# Patient Record
Sex: Female | Born: 1995 | Race: White | Hispanic: No | Marital: Single
Health system: Midwestern US, Community
[De-identification: ages and names within clinical notes are randomized; demographics above are authoritative.]

---
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
## 2019-05-29 ENCOUNTER — Other Ambulatory Visit: Payer: Self-pay

## 2019-06-03 ENCOUNTER — Ambulatory Visit (INDEPENDENT_AMBULATORY_CARE_PROVIDER_SITE_OTHER): Admitting: Certified Nurse Midwife

## 2019-06-03 ENCOUNTER — Encounter: Payer: Self-pay | Admitting: Certified Nurse Midwife

## 2019-06-03 ENCOUNTER — Other Ambulatory Visit (HOSPITAL_COMMUNITY)
Admission: RE | Admit: 2019-06-03 | Discharge: 2019-06-03 | Disposition: A | Discharge status: Home / Self Care | Source: Ambulatory Visit | Attending: Certified Nurse Midwife | Admitting: Certified Nurse Midwife

## 2019-06-03 VITALS — BP 124/72 | HR 88 | Temp 98.3°F | Resp 14 | Ht 63.0 in | Wt 198.2 lb

## 2019-06-03 DIAGNOSIS — Z01419 Encounter for gynecological examination (general) (routine) without abnormal findings: Secondary | ICD-10-CM | POA: Diagnosis not present

## 2019-06-03 DIAGNOSIS — Z124 Encounter for screening for malignant neoplasm of cervix: Secondary | ICD-10-CM

## 2019-06-03 DIAGNOSIS — Z30431 Encounter for routine checking of intrauterine contraceptive device: Secondary | ICD-10-CM

## 2019-06-03 DIAGNOSIS — Z789 Other specified health status: Secondary | ICD-10-CM

## 2019-06-03 NOTE — Progress Notes (Signed)
23 y.o. [id number] Married  Caucasian Fe here as a new patient to establish care and for gyn exam. Patient would like to discuss IUD removal. Mirena IUD inserted November 2017. Would like to have removal to try for pregnancy this year. Periods sporadic with Mirena. Has not tried to feel for string. Patient currently active in [employer]. She had c/section with first pregnancy and preeclampsia. She is hopeful to avoid any problems with the next pregnancy. 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 No other health issues today.  Patient's last menstrual period was 04/21/2019 (within days).          Sexually active: Yes.    The current method of family planning is IUD.    Exercising: Yes.    walking Smoker:  no  Review of Systems  Constitutional: Negative.   HENT: Negative.   Eyes: Negative.   Respiratory: Negative.   Cardiovascular: Negative.   Gastrointestinal: Negative.   Genitourinary: Negative.   Musculoskeletal: Negative.   Skin: Negative.   Neurological: Negative.   Endo/Heme/Allergies: Negative.   Psychiatric/Behavioral: Negative.     Health Maintenance: Pap: about 2018 per patient -- [place] in [place] -- normal History of Abnormal Pap: no MMG:  n/a Self Breast exams: yes Colonoscopy:  n/a BMD:   n/a TDaP:  2017 Shingles: n/a Pneumonia: n/a Hep C and HIV: negative in the past per patient  Labs: none   reports that she has never smoked. She has never used smokeless tobacco. She reports that she does not drink alcohol or use drugs.  History reviewed. No pertinent past medical history.  Past Surgical History:  Procedure Laterality Date  . CESAREAN SECTION      No current outpatient medications on file.   No current facility-administered medications for this visit.     Family History  Problem Relation Age of Onset  . Breast cancer Maternal Grandmother   . Colon cancer Maternal Grandfather     ROS:  Pertinent items are noted in HPI.   Otherwise, a comprehensive ROS was negative.  Exam:   BP 124/72 (BP Location: Right Arm, Patient Position: Sitting, Cuff Size: Large)   Pulse 88   Temp 98.3 F (36.8 C) (Temporal)   Resp 14   Ht 5\' 3"  (1.6 m)   Wt 198 lb 3.2 oz (89.9 kg)   LMP 04/21/2019 (Within Days)   BMI 35.11 kg/m  Height: 5\' 3"  (160 cm) Ht Readings from Last 3 Encounters:  06/03/19 5\' 3"  (1.6 m)    General appearance: alert, cooperative and appears stated age Head: Normocephalic, without obvious abnormality, atraumatic Neck: no adenopathy, supple, symmetrical, trachea midline and thyroid normal to inspection and palpation Lungs: clear to auscultation bilaterally Breasts: normal appearance, no masses or tenderness, No nipple retraction or dimpling, No nipple discharge or bleeding Heart: regular rate and rhythm Abdomen: soft, non-tender; no masses,  no organomegaly Extremities: extremities normal, atraumatic, no cyanosis or edema Skin: Skin color, texture, turgor normal. Has eczema fine rash at waistline, on upper arms, no weeping noted. Lymph nodes: Cervical, supraclavicular, and axillary nodes normal. No abnormal inguinal nodes palpated Neurologic: Grossly normal   Pelvic: External genitalia:  no lesions, normal female              Urethra:  normal appearing urethra with no masses, tenderness or lesions              Bartholin's and Skene's: normal  Vagina: normal appearing vagina with normal color and discharge, no lesions              Cervix: no cervical motion tenderness, no lesions and normal appearance IUD string noted in cervix              Pap taken: Yes.   Bimanual Exam:  Uterus:  normal size, contour, position, consistency, mobility, non-tender and anteverted              Adnexa: normal adnexa and no mass, fullness, tenderness               Rectovaginal: Confirms               Anus:  normal appearance, no lesions  Chaperone present: yes  A:  Well Woman with normal  exam  Contraception Mirena IUD   Overweight  Planning pregnancy  Rubella immune status unknown  Active duty in [employer]  IUD removal requested  P:   Reviewed health and wellness pertinent to exam  Discussed planning pregnancy and having less weight at onset. Patient aware and trying to work on. Discussed starting on Prenatal Vitamins now. Avoid ETOH. Discussed Rubella immune status. Will draw lab today.  Discussed order to be placed for IUD removal and she will be called with insurance information and scheduled for removal. Questions addressed.  Pap smear: yes   counseled on breast self exam, feminine hygiene, adequate intake of calcium and vitamin D, diet and exercise  return annually or prn  An After Visit Summary was printed and given to the patient.

## 2019-06-03 NOTE — Patient Instructions (Signed)
EXERCISE AND DIET:  We recommended that you start or continue a regular exercise program for good health. Regular exercise means any activity that makes your heart beat faster and makes you sweat.  We recommend exercising at least 30 minutes per day at least 3 days a week, preferably 4 or 5.  We also recommend a diet low in fat and sugar.  Inactivity, poor dietary choices and obesity can cause diabetes, heart attack, stroke, and kidney damage, among others.    ALCOHOL AND SMOKING:  Women should limit their alcohol intake to no more than 7 drinks/beers/glasses of wine (combined, not each!) per week. Moderation of alcohol intake to this level decreases your risk of breast cancer and liver damage. And of course, no recreational drugs are part of a healthy lifestyle.  And absolutely no smoking or even second hand smoke. Most people know smoking can cause heart and lung diseases, but did you know it also contributes to weakening of your bones? Aging of your skin?  Yellowing of your teeth and nails?  CALCIUM AND VITAMIN D:  Adequate intake of calcium and Vitamin D are recommended.  The recommendations for exact amounts of these supplements seem to change often, but generally speaking 600 mg of calcium (either carbonate or citrate) and 800 units of Vitamin D per day seems prudent. Certain women may benefit from higher intake of Vitamin D.  If you are among these women, your doctor will have told you during your visit.    PAP SMEARS:  Pap smears, to check for cervical cancer or precancers,  have traditionally been done yearly, although recent scientific advances have shown that most women can have pap smears less often.  However, every woman still should have a physical exam from her gynecologist every year. It will include a breast check, inspection of the vulva and vagina to check for abnormal growths or skin changes, a visual exam of the cervix, and then an exam to evaluate the size and shape of the uterus and  ovaries.  And after 23 years of age, a rectal exam is indicated to check for rectal cancers. We will also provide age appropriate advice regarding health maintenance, like when you should have certain vaccines, screening for sexually transmitted diseases, bone density testing, colonoscopy, mammograms, etc.    Healthy Weight Gain During Pregnancy, Adult A certain amount of weight gain during pregnancy is normal and healthy. How much weight you should gain depends on your overall health and a measurement called BMI (body mass index). BMI is an estimate of your body fat based on your height and weight. You can use an Software engineeronline calculator to figure out your BMI, or you can ask your health care provider to calculate it for you at your next visit. Your recommended pregnancy weight gain is based on your pre-pregnancy BMI. General guidelines for a healthy total weight gain during pregnancy are listed below. If your BMI at or before the start of your pregnancy is:  Less than 18.5 (underweight), you should gain 28-40 lb (13-18 kg).  18.5-24.9 (normal weight), you should gain 25-35 lb (11-16 kg).  25-29.9 (overweight), you should gain 15-25 lb (7-11 kg).  30 or higher (obese), you should gain 11-20 lb (5-9 kg). These ranges vary depending on your individual health. If you are carrying more than one baby (multiples), it may be safe to gain more weight than these recommendations. If you gain less weight than recommended, that may be safe as long as your baby is  growing and developing normally. How can unhealthy weight gain affect me and my baby? Gaining too much weight during pregnancy can lead to pregnancy complications, such as:  A temporary form of diabetes that develops during pregnancy (gestational diabetes).  High blood pressure during pregnancy and protein in your urine (preeclampsia).  High blood pressure during pregnancy without protein in your urine (gestational hypertension).  Your baby having a  high weight at birth, which may: ? Raise your risk of having a more difficult delivery or a surgical delivery (cesarean delivery, or C-section). ? Raise your child's risk of developing obesity during childhood. Not gaining enough weight can be life-threatening for your baby, and it may raise your baby's chances of:  Being born early (preterm).  Growing more slowly than normal during pregnancy (growth restriction).  Having a low weight at birth. What actions can I take to gain a healthy amount of weight during pregnancy? General instructions  Keep track of your weight gain during pregnancy.  Take over-the-counter and prescription medicines only as told by your health care provider. Take all prenatal supplements as directed.  Keep all health care visits during pregnancy (prenatal visits). These visits are a good time to discuss your weight gain. Your health care provider will weigh you at each visit to make sure you are gaining a healthy amount of weight. Nutrition   Eat a balanced, nutrient-rich diet. Eat plenty of: ? Fruits and vegetables, such as berries and broccoli. ? Whole grains, such as millet, barley, whole-wheat breads and cereals, and oatmeal. ? Low-fat dairy products or non-dairy products such as almond milk or rice milk. ? Protein foods, such as lean meat, chicken, eggs, and legumes (such as peas, beans, soybeans, and lentils).  Avoid foods that are fried or have a lot of fat, salt (sodium), or sugar.  Drink enough fluid to keep your urine pale yellow.  Choose healthy snack and drink options when you are at work or on the go: ? Drink water. Avoid soda, sports drinks, and juices that have added sugar. ? Avoid drinks with caffeine, such as coffee and energy drinks. ? Eat snacks that are high in protein, such as nuts, protein bars, and low-fat yogurt. ? Carry convenient snacks in your purse that do not need refrigeration, such as a pack of trail mix, an apple, or a granola  bar.  If you need help improving your diet, work with a health care provider or a diet and nutrition specialist (dietitian). Activity   Exercise regularly, as told by your health care provider. ? If you were active before becoming pregnant, you may be able to continue your regular fitness activities. ? If you were not active before pregnancy, you may gradually build up to exercising for 30 or more minutes on most days of the week. This may include walking, swimming, or yoga.  Ask your health care provider what activities are safe for you. Talk with your health care provider about whether you may need to be excused from certain school or work activities. Where to find more information Learn more about managing your weight gain during pregnancy from:  American Pregnancy Association: www.americanpregnancy.org  [place] Department of Agriculture pregnancy weight gain calculator: https://ball-collins.biz/ Summary  Too much weight gain during pregnancy can lead to complications for you and your baby.  Find out your pre-pregnancy BMI to determine how much weight gain is healthy for you.  Eat nutritious foods and stay active.  Keep all of your prenatal visits as told by  your health care provider. This information is not intended to replace advice given to you by your health care provider. Make sure you discuss any questions you have with your health care provider. Document Released: 06/01/2017 Document Revised: 06/01/2017 Document Reviewed: 06/01/2017 Elsevier Patient Education  2020 [employer].

## 2019-06-04 ENCOUNTER — Telehealth: Payer: Self-pay

## 2019-06-04 ENCOUNTER — Telehealth: Payer: Self-pay | Admitting: *Deleted

## 2019-06-04 DIAGNOSIS — Z30432 Encounter for removal of intrauterine contraceptive device: Secondary | ICD-10-CM

## 2019-06-04 LAB — CYTOLOGY - PAP: Diagnosis: NEGATIVE

## 2019-06-04 LAB — RUBELLA SCREEN: Rubella Antibodies, IGG: 2.31 index (ref >0.99)

## 2019-06-04 NOTE — Telephone Encounter (Signed)
-----   Message from Regina Eck, CNM sent at 06/04/2019  1:14 PM EDT ----- Notify patient her Rubella screen showed immune status no concerns

## 2019-06-04 NOTE — Telephone Encounter (Signed)
No voicemail. Try again to reach patient to give her lab results.

## 2019-06-04 NOTE — Telephone Encounter (Signed)
-----   Message from Regina Eck, CNM sent at 06/03/2019  2:34 PM EDT ----- New patient today for gyn exam requests scheduling to remove Mirena IUD to try for pregnancyAware she will be called with insurance information and scheduled.

## 2019-06-04 NOTE — Telephone Encounter (Signed)
Order placed for IUD removal.   Routing to [employer] and [employer]  for [employer].

## 2019-06-04 NOTE — Telephone Encounter (Signed)
Call placed to patient to check if she has a PCP as it is required for every visit per [employer]. Patient states she will call her PCP to get a referral for her visit. Patient states she will call me back once she is able to obtain the referral.

## 2019-06-04 NOTE — Telephone Encounter (Signed)
Mailbox full. Try again. 

## 2019-06-06 NOTE — Telephone Encounter (Signed)
Letter sent.

## 2019-06-18 NOTE — Telephone Encounter (Signed)
No returned call. Okay to close encounter per Melvia Heaps, cnm

## 2019-07-09 ENCOUNTER — Telehealth: Payer: Self-pay | Admitting: Certified Nurse Midwife

## 2019-07-09 NOTE — Telephone Encounter (Signed)
Rosa -can you follow-up with patient regarding IUD removal?

## 2019-07-09 NOTE — Telephone Encounter (Signed)
Call placed to follow up on pcp referral for appointment.

## 2019-08-13 ENCOUNTER — Telehealth: Payer: Self-pay | Admitting: Certified Nurse Midwife

## 2019-08-13 NOTE — Telephone Encounter (Signed)
Mirena IUD placed 07/2016 Order previously placed for IUD removal.   Patient has not scheduled to date.   Routing to [employer], CNM FYI.   Encounter closed.   Cc: Magdalene Patricia

## 2019-08-13 NOTE — Telephone Encounter (Signed)
Second call to follow up with patient on referral from PCP. Spoke with patient she states she is working on getting this referral from her new PCP. She has an appointment this week at her PCP  and will get the referral. Patient to call back once she gets the referral.

## 2019-08-14 NOTE — Telephone Encounter (Signed)
Thank you :)

## 2019-09-22 ENCOUNTER — Emergency Department (HOSPITAL_COMMUNITY)

## 2019-09-22 ENCOUNTER — Encounter (HOSPITAL_COMMUNITY): Payer: Self-pay | Admitting: *Deleted

## 2019-09-22 ENCOUNTER — Other Ambulatory Visit: Payer: Self-pay

## 2019-09-22 ENCOUNTER — Emergency Department (HOSPITAL_COMMUNITY)
Admission: EM | Admit: 2019-09-22 | Discharge: 2019-09-22 | Disposition: A | Discharge status: Home / Self Care | Attending: Emergency Medicine | Admitting: Emergency Medicine

## 2019-09-22 ENCOUNTER — Ambulatory Visit (HOSPITAL_BASED_OUTPATIENT_CLINIC_OR_DEPARTMENT_OTHER): Admit: 2019-09-22 | Discharge: 2019-09-22 | Disposition: A | Discharge status: Home / Self Care

## 2019-09-22 DIAGNOSIS — R609 Edema, unspecified: Secondary | ICD-10-CM

## 2019-09-22 DIAGNOSIS — M25561 Pain in right knee: Secondary | ICD-10-CM | POA: Diagnosis present

## 2019-09-22 DIAGNOSIS — Y9259 Other trade areas as the place of occurrence of the external cause: Secondary | ICD-10-CM | POA: Diagnosis not present

## 2019-09-22 DIAGNOSIS — Y939 Activity, unspecified: Secondary | ICD-10-CM | POA: Diagnosis not present

## 2019-09-22 DIAGNOSIS — X500XXA Overexertion from strenuous movement or load, initial encounter: Secondary | ICD-10-CM | POA: Diagnosis not present

## 2019-09-22 DIAGNOSIS — Y990 Civilian activity done for income or pay: Secondary | ICD-10-CM | POA: Insufficient documentation

## 2019-09-22 DIAGNOSIS — M79604 Pain in right leg: Secondary | ICD-10-CM | POA: Diagnosis not present

## 2019-09-22 IMAGING — CR DG KNEE COMPLETE 4+V*R*
4 series · 4 of 4 positions shown · non-contrast
Comparison: None.

CLINICAL DATA: Right knee pain

EXAM:
RIGHT KNEE - COMPLETE 4+ VIEW

[knee ap]
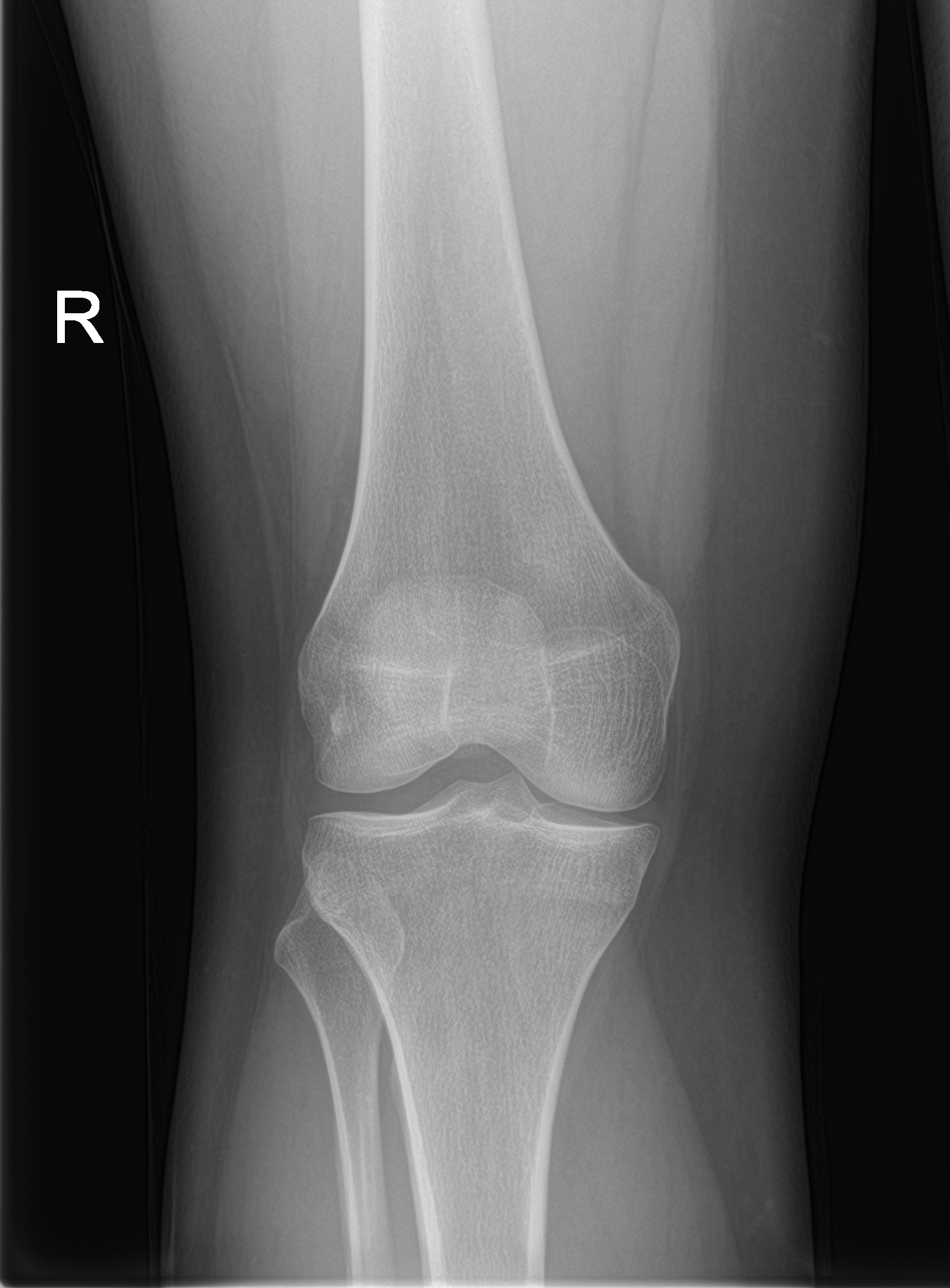

[knee lat]
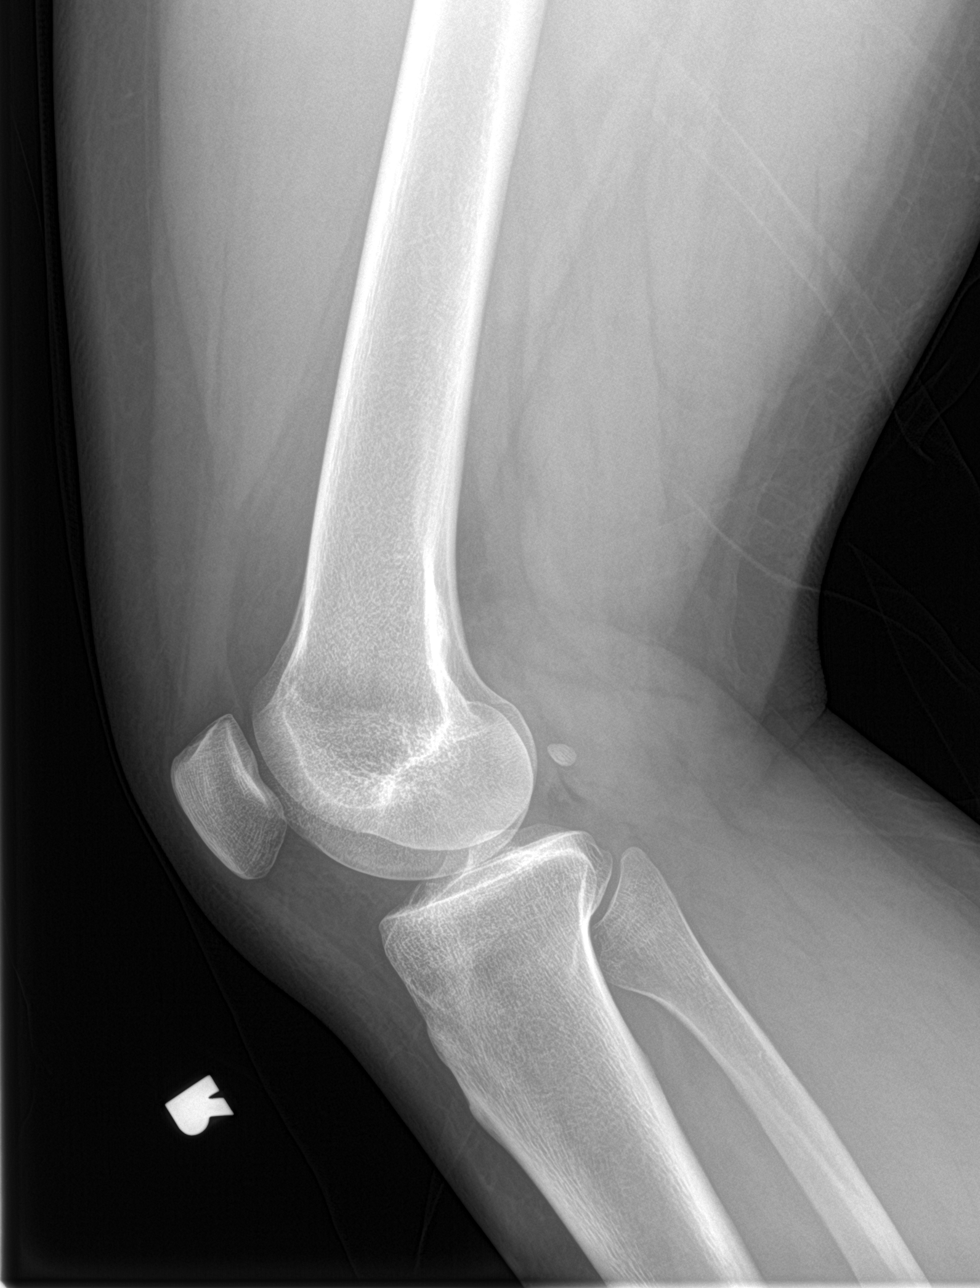

[knee obl (1 of 2)]
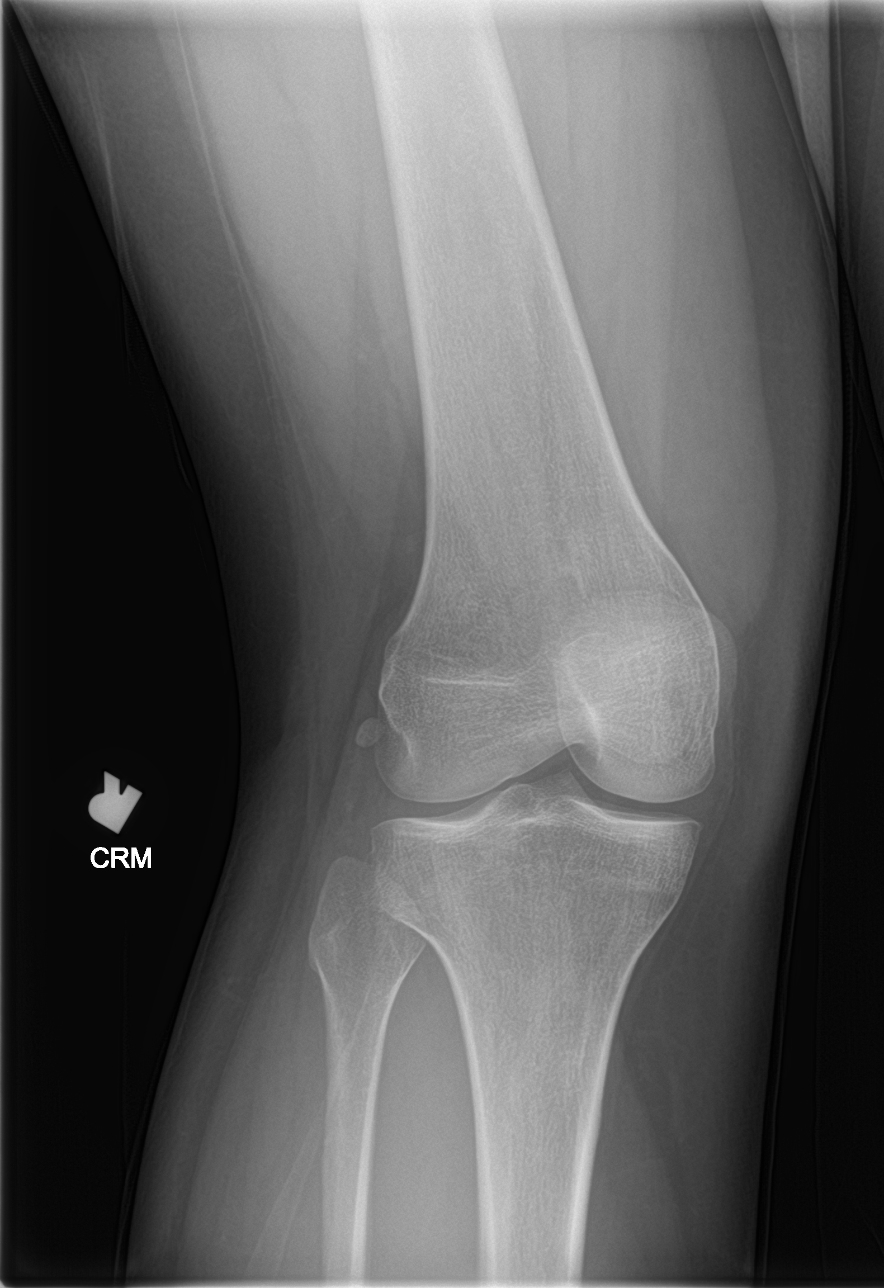

[knee obl (2 of 2)]
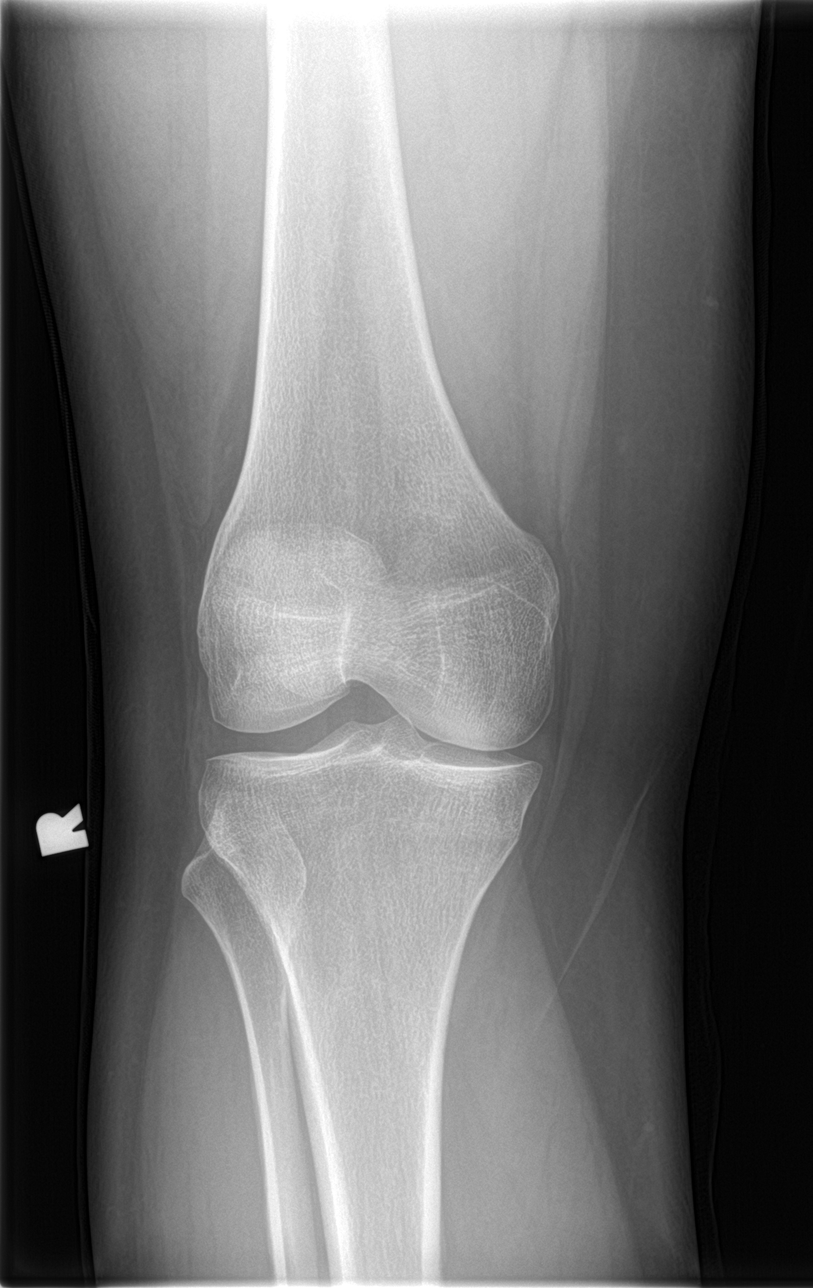

[4 of 4 positions shown; findings below may reference images not displayed]

FINDINGS: No evidence of fracture, dislocation, or joint effusion. Fabella
noted. No evidence of arthropathy or other focal bone abnormality.
Soft tissues are unremarkable.
IMPRESSION: Negative.

## 2019-09-22 MED ORDER — ACETAMINOPHEN 325 MG PO TABS
650.0000 mg | ORAL_TABLET | Freq: Once | ORAL | Status: AC
  Administered 2019-09-22: 650 mg via ORAL
  Filled 2019-09-22: qty 2

## 2019-09-22 NOTE — ED Provider Notes (Signed)
[facility] EMERGENCY DEPARTMENT Provider Note   CSN: Cox Arrival date & time: 09/22/19  4709    History Chief Complaint  Patient presents with  . Leg Pain    Destiny Cox is a 23 y.o. female with no significant past medical history who presents for evaluation of right leg pain.  Pain began 4 days PTA on 09/18/2019.  Patient states she was at work when she twisted.  Patient felt an immediate pain to her right knee.  Pain located more so to the medial posterior portion of her knee.  Since then she has had some swelling to the medial, superior aspect of her knee as well as into her right calf.  She has been able to ambulate without difficulty.  Patient states she has had some mild pain with distention of her knee.  Took 1 dose of ibuprofen yesterday.  No prior history of PE, DVT.  She is on exogenous hormone use.  Rates her current pain a 3/10.  Yesterday patient states her pain was an 8-9/10.  Patient states she was playing with her son on the floor was having difficulty getting up off the floor secondary to her right knee pain.  She denies any bony pain to her femur, bilateral hip/pelvis, tibia/fibula or foot.  Denies fever, chills, nausea, vomiting, chest pain, shortness of breath, abdominal pain, paresthesias, redness, warmth, ecchymosis, rashes or lesions to extremities.  Denies additional aggravating or alleviating factors. Denies chance of pregnancy.  History obtained from patient and past medical records.  Interpreter is used.  HPI     History reviewed. No pertinent past medical history.  There are no problems to display for this patient.   Past Surgical History:  Procedure Laterality Date  . CESAREAN SECTION       OB History    Gravida  1   Para  1   Term  0   Preterm  0   AB  0   Living  1     SAB  0   TAB  0   Ectopic  0   Multiple  0   Live Births  0           Family History  Problem Relation Age of Onset  . Breast  cancer Maternal Grandmother   . Colon cancer Maternal Grandfather     Social History   Tobacco Use  . Smoking status: Never Smoker  . Smokeless tobacco: Never Used  Substance Use Topics  . Alcohol use: Never  . Drug use: Never    Home Medications Prior to Admission medications   Not on File    Allergies    Red dye  Review of Systems   Review of Systems  Constitutional: Negative.   HENT: Negative.   Respiratory: Negative.   Cardiovascular: Negative.   Gastrointestinal: Negative.   Genitourinary: Negative.   Musculoskeletal: Negative for gait problem.       Right knee pain  Skin: Negative.   Neurological: Negative.   All other systems reviewed and are negative.   Physical Exam Updated Vital Signs BP 121/78 (BP Location: Left Arm)   Pulse 96   Temp 97.7 F (36.5 C) (Oral)   Resp 16   Wt 89.8 kg   SpO2 100%   BMI 35.07 kg/m   Physical Exam Vitals and nursing note reviewed.  Constitutional:      General: She is not in acute distress.    Appearance: She is well-developed. She is  obese. She is not ill-appearing or toxic-appearing.  HENT:     Head: Normocephalic and atraumatic.     Nose: Nose normal.     Mouth/Throat:     Mouth: Mucous membranes are moist.  Eyes:     Pupils: Pupils are equal, round, and reactive to light.  Cardiovascular:     Rate and Rhythm: Normal rate.     Pulses:          Dorsalis pedis pulses are 2+ on the right side and 2+ on the left side.       Posterior tibial pulses are 2+ on the right side and 2+ on the left side.  Pulmonary:     Effort: Pulmonary effort is normal. No respiratory distress.  Abdominal:     General: There is no distension.  Musculoskeletal:        General: Normal range of motion.     Cervical back: Normal range of motion.     Lumbar back: Normal.     Right upper leg: Normal.     Left upper leg: Normal.     Right knee: No swelling, deformity, effusion, erythema, ecchymosis, bony tenderness or crepitus.  Normal range of motion. Tenderness present over the medial joint line, lateral joint line and MCL. No patellar tendon tenderness. Normal patellar mobility. Normal pulse.     Instability Tests: Positive medial McMurray test.     Left knee: Normal.     Right lower leg: Normal.     Left lower leg: Normal.     Right foot: Normal.     Left foot: Normal.     Comments: No tenderness palpation to bilateral femurs, tibia/fibula.  No bony tenderness to bilateral knees.  Able to straight leg raise without difficulty.  Full range of motion to left knee.  Right knee with full range of motion however pain with extension to right knee.  Positive valgus stress to right knee however negative anterior drawer, varus stress.  No appreciable swelling to bilateral calves, no redness, warmth, rashes or lesions.  Bilateral compartments soft.  No tenderness over bilateral Achilles tendon. Negative Thompson Test  Feet:     Right foot:     Skin integrity: Skin integrity normal.     Left foot:     Skin integrity: Skin integrity normal.  Skin:    General: Skin is warm and dry.     Capillary Refill: Capillary refill takes less than 2 seconds.     Comments: No edema, erythema or warmth.  Brisk capillary refill.  No rashes or lesions.  Neurological:     Mental Status: She is alert.     Sensory: Sensation is intact.     Motor: Motor function is intact.     Comments: Intact sensation bilateral lower extremities without difficulty.  Ambulatory that difficulty    ED Results / Procedures / Treatments   Labs (all labs ordered are listed, but only abnormal results are displayed) Labs Reviewed - No data to display  EKG None  Radiology DG Knee Complete 4 Views Right  Result Date: 09/22/2019 CLINICAL DATA:  Right knee pain EXAM: RIGHT KNEE - COMPLETE 4+ VIEW COMPARISON:  None. FINDINGS: No evidence of fracture, dislocation, or joint effusion. Fabella noted. No evidence of arthropathy or other focal bone abnormality. Soft  tissues are unremarkable. IMPRESSION: Negative. Electronically Signed   By: Duanne Guess D.O.   On: 09/22/2019 09:58   VAS [place] LOWER EXTREMITY VENOUS (DVT) (ONLY [facility] & [facility])  Result  Date: 09/22/2019  Lower Venous Study Indications: Edema.  Risk Factors: None identified. Comparison Study: No prior studies. Performing Technologist: Chanda Busing RVT  Examination Guidelines: A complete evaluation includes B-mode imaging, spectral Doppler, color Doppler, and power Doppler as needed of all accessible portions of each vessel. Bilateral testing is considered an integral part of a complete examination. Limited examinations for reoccurring indications may be performed as noted.  +---------+---------------+---------+-----------+----------+--------------+ RIGHT    CompressibilityPhasicitySpontaneityPropertiesThrombus Aging +---------+---------------+---------+-----------+----------+--------------+ CFV      Full           Yes      Yes                                 +---------+---------------+---------+-----------+----------+--------------+ SFJ      Full                                                        +---------+---------------+---------+-----------+----------+--------------+ FV Prox  Full                                                        +---------+---------------+---------+-----------+----------+--------------+ FV Mid   Full                                                        +---------+---------------+---------+-----------+----------+--------------+ FV DistalFull                                                        +---------+---------------+---------+-----------+----------+--------------+ PFV      Full                                                        +---------+---------------+---------+-----------+----------+--------------+ POP      Full           Yes      Yes                                  +---------+---------------+---------+-----------+----------+--------------+ PTV      Full                                                        +---------+---------------+---------+-----------+----------+--------------+ PERO     Full                                                        +---------+---------------+---------+-----------+----------+--------------+   +----+---------------+---------+-----------+----------+--------------+  LEFTCompressibilityPhasicitySpontaneityPropertiesThrombus Aging +----+---------------+---------+-----------+----------+--------------+ CFV Full           Yes      Yes                                 +----+---------------+---------+-----------+----------+--------------+     Summary: Right: There is no evidence of deep vein thrombosis in the lower extremity. No cystic structure found in the popliteal fossa. Left: No evidence of common femoral vein obstruction.  *See table(s) above for measurements and observations.    Preliminary     Procedures .Ortho Injury Treatment  Date/Time: 09/22/2019 12:35 PM Performed by: Ralph LeydenHenderly, Jyquan Kenley A, PA-C Authorized by: Linwood DibblesHenderly, Gracie Gupta A, PA-C   Consent:    Consent obtained:  Verbal   Consent given by:  Patient   Risks discussed:  Fracture, nerve damage, restricted joint movement, vascular damage, stiffness, recurrent dislocation and irreducible dislocation   Alternatives discussed:  No treatment, alternative treatment, immobilization, referral and delayed treatmentInjury location: knee Location details: right knee Injury type: soft tissue Pre-procedure neurovascular assessment: neurovascularly intact Pre-procedure distal perfusion: normal Pre-procedure neurological function: normal Pre-procedure range of motion: normal  Anesthesia: Local anesthesia used: no  Patient sedated: NoImmobilization: splint and brace Splint type: Knee immobilizer. Post-procedure neurovascular assessment: post-procedure  neurovascularly intact Post-procedure distal perfusion: normal Post-procedure neurological function: normal Post-procedure range of motion: normal Patient tolerance: patient tolerated the procedure well with no immediate complications    (including critical care time)  Medications Ordered in ED Medications  acetaminophen (TYLENOL) tablet 650 mg (650 mg Oral Given 09/22/19 1024)   ED Course  I have reviewed the triage vital signs and the nursing notes.  Pertinent labs & imaging results that were available during my care of the patient were reviewed by me and considered in my medical decision making (see chart for details).  23 year old female appears otherwise well presents for evaluation of right knee pain.  She is afebrile, nonseptic, non-ill-appearing.  Pain with extension to right knee, positive valgus stress, negative anterior drawer, varus stress to right knee. Compartments soft.  Neurovascularly intact.  Ambulatory with out difficulty.  Patient feels right calf is more swollen than left however no appreciable edema, erythema, warmth, rashes or lesions.  Prior history of PE, DVT.  No chest pain, shortness of breath or hemoptysis.  No evidence of effusion on exam.  No bony tenderness to femur, knee, tibia or fibula or foot.  Negative Thompson test.  She does have tenderness to her medial joint line.  Low suspicion for septic joint, gout, hemarthrosis, fracture, dislocation, compartment syndrome, myositis.  Patient likely with MSK sprain or strain.  Plain films without fracture, dislocation or effusion. US negative for DVT. Will place in splint, crutches, RICE for sx management and have her follow up with Ortho outpatient.  The patient has been appropriately medically screened and/or stabilized in the ED. I have low suspicion for any other emergent medical condition which would require further screening, evaluation or treatment in the ED or require inpatient management.  Patient is  hemodynamically stable and in no acute distress.  Patient able to ambulate in department prior to ED.  Evaluation does not show acute pathology that would require ongoing or additional emergent interventions while in the emergency department or further inpatient treatment.  I have discussed the diagnosis with the patient and answered all questions.  Pain is been managed while in the emergency department and patient has no further complaints prior  to discharge.  Patient is comfortable with plan discussed in room and is stable for discharge at this time.  I have discussed strict return precautions for returning to the emergency department.  Patient was encouraged to follow-up with PCP/specialist refer to at discharge.    MDM Rules/Calculators/A&P                      Final Clinical Impression(s) / ED Diagnoses Final diagnoses:  Acute pain of right knee    Rx / DC Orders ED Discharge Orders    None       Jaxin Fulfer A, PA-C 09/22/19 1237    Tegeler, Gwenyth Allegra, MD 09/22/19 1700

## 2019-09-22 NOTE — ED Triage Notes (Signed)
Pt is here for pain and swelling in right leg.  Pt first felt this on 12/24.  Pt states that she turned at work and she felt a tugging sensation at the back of her knee which was a sharp pain.  Pt then had swelling in her inner thigh right above knee.  Then she had continued pain in her legs and some swelling on her right leg close to her knee, she also reports some limited range of motion.

## 2019-09-22 NOTE — Discharge Instructions (Addendum)
Take Tylenol and ibuprofen as needed for pain.  Do not take ibuprofen if you think you may be pregnant.  You may take a maximum of 4000 mg Tylenol daily as well as 2400 mg of ibuprofen in 24-hour period.  I would suggest ice, elevation of your leg.  Follow-up with orthopedics.  Return to the ED for new worsening symptoms. Your ultrasound was negative for blood clot.  This is likely musculoskeletal injury.  Follow-up with orthopedics.

## 2019-09-22 NOTE — Progress Notes (Signed)
Right lower extremity venous duplex has been completed. Preliminary results can be found in CV Proc through chart review.  Results were given to [facility] PA.  09/22/19 11:49 AM Carlos Levering RVT

## 2019-12-17 ENCOUNTER — Encounter: Payer: Self-pay | Admitting: Certified Nurse Midwife

## 2021-08-06 ENCOUNTER — Inpatient Hospital Stay (HOSPITAL_BASED_OUTPATIENT_CLINIC_OR_DEPARTMENT_OTHER): Payer: Medicaid Other

## 2021-08-06 ENCOUNTER — Inpatient Hospital Stay (HOSPITAL_COMMUNITY)
Admission: AD | Admit: 2021-08-06 | Discharge: 2021-08-09 | DRG: 787 | Disposition: A | Discharge status: Home / Self Care | Payer: Medicaid Other | Attending: Obstetrics and Gynecology | Admitting: Obstetrics and Gynecology

## 2021-08-06 ENCOUNTER — Other Ambulatory Visit: Payer: Self-pay

## 2021-08-06 ENCOUNTER — Encounter (HOSPITAL_COMMUNITY): Payer: Self-pay

## 2021-08-06 DIAGNOSIS — O34211 Maternal care for low transverse scar from previous cesarean delivery: Secondary | ICD-10-CM | POA: Diagnosis present

## 2021-08-06 DIAGNOSIS — O0933 Supervision of pregnancy with insufficient antenatal care, third trimester: Secondary | ICD-10-CM | POA: Diagnosis not present

## 2021-08-06 DIAGNOSIS — Z98891 History of uterine scar from previous surgery: Secondary | ICD-10-CM

## 2021-08-06 DIAGNOSIS — Z3689 Encounter for other specified antenatal screening: Secondary | ICD-10-CM | POA: Diagnosis not present

## 2021-08-06 DIAGNOSIS — O0930 Supervision of pregnancy with insufficient antenatal care, unspecified trimester: Secondary | ICD-10-CM

## 2021-08-06 DIAGNOSIS — O9081 Anemia of the puerperium: Secondary | ICD-10-CM | POA: Diagnosis not present

## 2021-08-06 DIAGNOSIS — Z20822 Contact with and (suspected) exposure to covid-19: Secondary | ICD-10-CM | POA: Diagnosis present

## 2021-08-06 DIAGNOSIS — Z3A39 39 weeks gestation of pregnancy: Secondary | ICD-10-CM

## 2021-08-06 DIAGNOSIS — O368130 Decreased fetal movements, third trimester, not applicable or unspecified: Secondary | ICD-10-CM

## 2021-08-06 DIAGNOSIS — O34219 Maternal care for unspecified type scar from previous cesarean delivery: Secondary | ICD-10-CM

## 2021-08-06 DIAGNOSIS — O368190 Decreased fetal movements, unspecified trimester, not applicable or unspecified: Secondary | ICD-10-CM | POA: Diagnosis not present

## 2021-08-06 DIAGNOSIS — D62 Acute posthemorrhagic anemia: Secondary | ICD-10-CM | POA: Diagnosis not present

## 2021-08-06 DIAGNOSIS — O3663X0 Maternal care for excessive fetal growth, third trimester, not applicable or unspecified: Secondary | ICD-10-CM

## 2021-08-06 DIAGNOSIS — Z6790 Unspecified blood type, Rh positive: Secondary | ICD-10-CM

## 2021-08-06 LAB — RAPID URINE DRUG SCREEN, HOSP PERFORMED
Amphetamines: NOT DETECTED
Barbiturates: NOT DETECTED
Benzodiazepines: NOT DETECTED
Cocaine: NOT DETECTED
Opiates: NOT DETECTED
Tetrahydrocannabinol: NOT DETECTED

## 2021-08-06 LAB — WET PREP, GENITAL
Clue Cells Wet Prep HPF POC: NONE SEEN
Sperm: NONE SEEN
Trich, Wet Prep: NONE SEEN
Yeast Wet Prep HPF POC: NONE SEEN

## 2021-08-06 LAB — DIFFERENTIAL
Abs Immature Granulocytes: 0.16 10*3/uL — ABNORMAL HIGH (ref 0.00–0.07)
Basophils Absolute: 0 10*3/uL (ref 0.0–0.1)
Basophils Relative: 0 %
Eosinophils Absolute: 0.1 10*3/uL (ref 0.0–0.5)
Eosinophils Relative: 1 %
Immature Granulocytes: 1 %
Lymphocytes Relative: 18 %
Lymphs Abs: 2.4 10*3/uL (ref 0.7–4.0)
Monocytes Absolute: 1 10*3/uL (ref 0.1–1.0)
Monocytes Relative: 7 %
Neutro Abs: 9.8 10*3/uL — ABNORMAL HIGH (ref 1.7–7.7)
Neutrophils Relative %: 73 %

## 2021-08-06 LAB — CBC
HCT: 34.7 % — ABNORMAL LOW (ref 36.0–46.0)
Hemoglobin: 11.5 g/dL — ABNORMAL LOW (ref 12.0–15.0)
MCH: 30.6 pg (ref 26.0–34.0)
MCHC: 33.1 g/dL (ref 30.0–36.0)
MCV: 92.3 fL (ref 80.0–100.0)
Platelets: 224 10*3/uL (ref 150–400)
RBC: 3.76 MIL/uL — ABNORMAL LOW (ref 3.87–5.11)
RDW: 12.8 % (ref 11.5–15.5)
WBC: 13.4 10*3/uL — ABNORMAL HIGH (ref 4.0–10.5)
nRBC: 0 % (ref 0.0–0.2)

## 2021-08-06 LAB — TYPE AND SCREEN
ABO/RH(D): B POS
Antibody Screen: NEGATIVE

## 2021-08-06 LAB — HIV ANTIBODY (ROUTINE TESTING W REFLEX): HIV Screen 4th Generation wRfx: NONREACTIVE

## 2021-08-06 MED ORDER — SOD CITRATE-CITRIC ACID 500-334 MG/5ML PO SOLN: 30.0000 mL | ORAL | Status: DC

## 2021-08-06 MED ORDER — CEFAZOLIN SODIUM-DEXTROSE 2-4 GM/100ML-% IV SOLN
2.0000 g | INTRAVENOUS | Status: AC
  Administered 2021-08-07: 2 g via INTRAVENOUS
  Filled 2021-08-06: qty 100

## 2021-08-06 NOTE — H&P (Signed)
OBSTETRIC ADMISSION HISTORY AND PHYSICAL  Destiny Cox is a 25 y.o. female G2P1001 with IUP at [redacted]w[redacted]d by LMP presenting for decreased fetal movement. She has only felt a couple of movements over the last 24 hours. She denies loss of fluid or vaginal bleeding. She denies abdominal pain.  Patient reported some prenatal care with [facility]. No records able to be found after contracting practice.   Dating: By LMP --->  Estimated Date of Delivery: 08/13/21  Sono: Limited US in MAU 08/06/21  @[redacted]w[redacted]d , normal though limited given gestational age and body habitus, cephalic presentation, posterior placental lie, 4287g, 97% EFW  Prenatal History/Complications:  Hx of cesarean section 5 years ago for large fetal size  Past Medical History: History reviewed. No pertinent past medical history.  Past Surgical History: Past Surgical History:  Procedure Laterality Date   CESAREAN SECTION      Obstetrical History: OB History     Gravida  2   Para  1   Term  1   Preterm  0   AB  0   Living  1      SAB  0   IAB  0   Ectopic  0   Multiple  0   Live Births  1           Social History Social History   Socioeconomic History   Marital status: Married    Spouse name: Not on file   Number of children: Not on file   Years of education: Not on file   Highest education level: Not on file  Occupational History   Not on file  Tobacco Use   Smoking status: Never   Smokeless tobacco: Never  Vaping Use   Vaping Use: Never used  Substance and Sexual Activity   Alcohol use: Never   Drug use: Never   Sexual activity: Yes  Other Topics Concern   Not on file  Social History Narrative   Not on file   Social Determinants of Health   Financial Resource Strain: Not on file  Food Insecurity: Not on file  Transportation Needs: Not on file  Physical Activity: Not on file  Stress: Not on file  Social Connections: Not on file    Family History: Family History  Problem  Relation Age of Onset   Breast cancer Maternal Grandmother    Colon cancer Maternal Grandfather     Allergies: Allergies  Allergen Reactions   Red Dye Hives    No medications prior to admission.    Review of Systems  All systems reviewed and negative except as stated in HPI  Blood pressure 123/75, pulse 100, temperature 98 F (36.7 C), temperature source Oral, resp. rate 16, SpO2 98 %.  General appearance: alert, cooperative, and no distress Lungs: normal work of breathing on room air  Heart: normal rate, warm and well perfused Abdomen: soft, non-tender, gravid, well-healed low transverse scar over lower abdomen Extremities: no LE edema  Presentation: Cephalic per  Fetal monitoring: Baseline 130 bpm, moderate variability, + accels, no decels  Uterine activity: Irregular contractions, uterine irritability   Prenatal labs: ABO, Rh: --/--/B POS (11/12 2035) Antibody: NEG (11/12 2035) Rubella:  Pending RPR:  Pending  HBsAg:  Pending HIV: Non Reactive (11/12 1932) GBS:  Pending  Genetic screening - Not done Anatomy 06[birth year]1977 - Not done, limited US in MAU with grossly normal but limited anatomy  Prenatal Transfer Tool  Maternal Diabetes: Unknown, GTT not done Genetic Screening: Not done  Maternal Ultrasounds/Referrals: None Fetal Ultrasounds or other Referrals: None Maternal Substance Abuse:  No; UDS negative Significant Maternal Medications:  None Significant Maternal Lab Results: None, initial prenatal labs pending   Results for orders placed or performed during the hospital encounter of 08/06/21 (from the past 24 hour(s))  Urine rapid drug screen (hosp performed)   Collection Time: 08/06/21  7:32 PM  Result Value Ref Range   Opiates NONE DETECTED NONE DETECTED   Cocaine NONE DETECTED NONE DETECTED   Benzodiazepines NONE DETECTED NONE DETECTED   Amphetamines NONE DETECTED NONE DETECTED   Tetrahydrocannabinol NONE DETECTED NONE DETECTED   Barbiturates NONE DETECTED  NONE DETECTED  CBC   Collection Time: 08/06/21  7:32 PM  Result Value Ref Range   WBC 13.4 (H) 4.0 - 10.5 K/uL   RBC 3.76 (L) 3.87 - 5.11 MIL/uL   Hemoglobin 11.5 (L) 12.0 - 15.0 g/dL   HCT 95.0 (L) 93.2 - 67.1 %   MCV 92.3 80.0 - 100.0 fL   MCH 30.6 26.0 - 34.0 pg   MCHC 33.1 30.0 - 36.0 g/dL   RDW 24.5 80.9 - 98.3 %   Platelets 224 150 - 400 K/uL   nRBC 0.0 0.0 - 0.2 %  Differential   Collection Time: 08/06/21  7:32 PM  Result Value Ref Range   Neutrophils Relative % 73 %   Neutro Abs 9.8 (H) 1.7 - 7.7 K/uL   Lymphocytes Relative 18 %   Lymphs Abs 2.4 0.7 - 4.0 K/uL   Monocytes Relative 7 %   Monocytes Absolute 1.0 0.1 - 1.0 K/uL   Eosinophils Relative 1 %   Eosinophils Absolute 0.1 0.0 - 0.5 K/uL   Basophils Relative 0 %   Basophils Absolute 0.0 0.0 - 0.1 K/uL   Immature Granulocytes 1 %   Abs Immature Granulocytes 0.16 (H) 0.00 - 0.07 K/uL  HIV Antibody (routine testing w rflx)   Collection Time: 08/06/21  7:32 PM  Result Value Ref Range   HIV Screen 4th Generation wRfx Non Reactive Non Reactive  Wet prep, genital   Collection Time: 08/06/21  8:33 PM   Specimen: Vaginal  Result Value Ref Range   Yeast Wet Prep HPF POC NONE SEEN NONE SEEN   Trich, Wet Prep NONE SEEN NONE SEEN   Clue Cells Wet Prep HPF POC NONE SEEN NONE SEEN   WBC, Wet Prep HPF POC FEW (A) NONE SEEN   Sperm NONE SEEN   Type and screen [facility]   Collection Time: 08/06/21  8:35 PM  Result Value Ref Range   ABO/RH(D) B POS    Antibody Screen NEG    Sample Expiration      08/09/2021,2359 Performed at [facility], [address], [place], [place] [address]     Patient Active Problem List   Diagnosis Date Noted   Decreased fetal movement 08/06/2021    Assessment/Plan:  Destiny Cox is a 25 y.o. G2P1001 at [redacted]w[redacted]d here for decreased fetal movement with no record of prenatal care, found to have BPP 6/8 a term. Repeat cesarean section recommended.  The risks of surgery  were discussed with the patient including but were not limited to: bleeding which may require transfusion or reoperation; infection which may require antibiotics; injury to bowel, bladder, ureters or other surrounding organs; injury to the fetus; need for additional procedures including hysterectomy in the event of a life-threatening hemorrhage; formation of adhesions; placental abnormalities with subsequent pregnancies; incisional problems; thromboembolic phenomenon and  other postoperative/anesthesia complications.  The patient concurred with the proposed plan, giving informed written consent for the procedure.  Anesthesia and OR aware. Preoperative prophylactic antibiotics and SCDs ordered on call to the OR.  To OR when ready.  #Pain: Per anesthesia #FWB: Cat 1  #ID:  GBS status pending; Ancef ordered for pre-operative prophylaxis  #MOC: Planning for OCPs  #Limited prenatal care: No record of prenatal care at [facility]. Initial prenatal labs ordered. Will follow up results.   Worthy Rancher, MD  08/06/2021, 11:54 PM

## 2021-08-06 NOTE — MAU Note (Signed)
Destiny Cox is a 25 y.o. at [redacted]w[redacted]d here in MAU reporting: DFM over the past 24 hours, has only felt a couple of movements. Having some pelvic pain. Denies bleeding or LOF.  Onset of complaint: yesterday  Pain score: 7/10  Vitals:   08/06/21 1836  BP: 132/77  Pulse: (!) 102  Resp: 16  Temp: 98 F (36.7 C)  SpO2: 98%     FHT:142  Lab orders placed from triage: none

## 2021-08-06 NOTE — MAU Provider Note (Addendum)
History     CSN: [id number]  Arrival date and time: 08/06/21 3474   Event Date/Time   First Provider Initiated Contact with Patient 08/06/21 1918      Chief Complaint  Patient presents with   Decreased Fetal Movement   Pelvic Pain   HPI  Ms.Destiny Cox is a 25 y.o. female G2P1001 @ [redacted]w[redacted]d here in MAU with decreased fetal movement. The patient reports she is getting OB care at [facility]; she reports 5 visits with their office. She reports 2 ultrasounds in the pregnancy one [facility], and one at [facility].  She is unable to tell us who she saw in the office, or a specific providers names reports " I see someone different every time".  She did not do any genetic screening that she can remember.  Since her arrival to MAU she has felt her baby move but "only in the begging". She is planning on another C/s and reports that she was told to call the hospital to schedule.   First child lives with her.   She delivered her first baby in [place]; and this baby was born via C/S d/t size of the fetus.   OB History     Gravida  2   Para  1   Term  1   Preterm  0   AB  0   Living  1      SAB  0   IAB  0   Ectopic  0   Multiple  0   Live Births  1           History reviewed. No pertinent past medical history.  Past Surgical History:  Procedure Laterality Date   CESAREAN SECTION      Family History  Problem Relation Age of Onset   Breast cancer Maternal Grandmother    Colon cancer Maternal Grandfather     Social History   Tobacco Use   Smoking status: Never   Smokeless tobacco: Never  Vaping Use   Vaping Use: Never used  Substance Use Topics   Alcohol use: Never   Drug use: Never    Allergies:  Allergies  Allergen Reactions   Red Dye Hives    No medications prior to admission.   Results for orders placed or performed during the hospital encounter of 08/06/21 (from the past 48 hour(s))  Urine rapid drug screen (hosp  performed)     Status: None   Collection Time: 08/06/21  7:32 PM  Result Value Ref Range   Opiates NONE DETECTED NONE DETECTED   Cocaine NONE DETECTED NONE DETECTED   Benzodiazepines NONE DETECTED NONE DETECTED   Amphetamines NONE DETECTED NONE DETECTED   Tetrahydrocannabinol NONE DETECTED NONE DETECTED   Barbiturates NONE DETECTED NONE DETECTED    Comment: (NOTE) DRUG SCREEN FOR MEDICAL PURPOSES ONLY.  IF CONFIRMATION IS NEEDED FOR ANY PURPOSE, NOTIFY LAB WITHIN 5 DAYS.  LOWEST DETECTABLE LIMITS FOR URINE DRUG SCREEN Drug Class                     Cutoff (ng/mL) Amphetamine and metabolites    1000 Barbiturate and metabolites    200 Benzodiazepine                 200 Tricyclics and metabolites     300 Opiates and metabolites        300 Cocaine and metabolites        300 THC  50 Performed at [facility], [address], [place], [place] [address]   CBC     Status: Abnormal   Collection Time: 08/06/21  7:32 PM  Result Value Ref Range   WBC 13.4 (H) 4.0 - 10.5 K/uL   RBC 3.76 (L) 3.87 - 5.11 MIL/uL   Hemoglobin 11.5 (L) 12.0 - 15.0 g/dL   HCT 08.6 (L) 76.1 - 95.0 %   MCV 92.3 80.0 - 100.0 fL   MCH 30.6 26.0 - 34.0 pg   MCHC 33.1 30.0 - 36.0 g/dL   RDW 93.2 67.1 - 24.5 %   Platelets 224 150 - 400 K/uL   nRBC 0.0 0.0 - 0.2 %    Comment: Performed at [facility], [address] [place], [place] [address]  Differential     Status: Abnormal   Collection Time: 08/06/21  7:32 PM  Result Value Ref Range   Neutrophils Relative % 73 %   Neutro Abs 9.8 (H) 1.7 - 7.7 K/uL   Lymphocytes Relative 18 %   Lymphs Abs 2.4 0.7 - 4.0 K/uL   Monocytes Relative 7 %   Monocytes Absolute 1.0 0.1 - 1.0 K/uL   Eosinophils Relative 1 %   Eosinophils Absolute 0.1 0.0 - 0.5 K/uL   Basophils Relative 0 %   Basophils Absolute 0.0 0.0 - 0.1 K/uL   Immature Granulocytes 1 %   Abs Immature Granulocytes 0.16 (H) 0.00 - 0.07 K/uL    Comment: Performed at  [facility], [address] [place], [place] [address]  HIV Antibody (routine testing w rflx)     Status: None   Collection Time: 08/06/21  7:32 PM  Result Value Ref Range   HIV Screen 4th Generation wRfx Non Reactive Non Reactive    Comment: Performed at [facility], [address], [place], [place] [address]  Wet prep, genital     Status: Abnormal   Collection Time: 08/06/21  8:33 PM   Specimen: Vaginal  Result Value Ref Range   Yeast Wet Prep HPF POC NONE SEEN NONE SEEN   Trich, Wet Prep NONE SEEN NONE SEEN    Comment: Specimen diluted due to transport tube containing more than 1 ml of saline, interpret results with caution.   Clue Cells Wet Prep HPF POC NONE SEEN NONE SEEN   WBC, Wet Prep HPF POC FEW (A) NONE SEEN   Sperm NONE SEEN     Comment: Performed at [facility], [address] [place], [place] [address]  Type and screen [facility]     Status: None   Collection Time: 08/06/21  8:35 PM  Result Value Ref Range   ABO/RH(D) B POS    Antibody Screen NEG    Sample Expiration      08/09/2021,2359 Performed at [facility], [address] [place], [place] [address]      Review of Systems  Gastrointestinal:  Negative for abdominal pain.  Genitourinary:  Negative for vaginal bleeding and vaginal discharge.  Physical Exam   Blood pressure 123/75, pulse 100, temperature 98 F (36.7 C), temperature source Oral, resp. rate 16, SpO2 98 %.  Physical Exam Vitals and nursing note reviewed.  Constitutional:      General: She is not in acute distress.    Appearance: Normal appearance. She is not ill-appearing, toxic-appearing or diaphoretic.  HENT:     Head: Normocephalic.  Abdominal:     Comments: Fundal  height 43 cm  Musculoskeletal:        General: Normal range of motion.  Skin:    General: Skin is warm.  Neurological:     Mental Status: She is alert and oriented to person, place, and time.  Psychiatric:        Behavior:  Behavior normal.   Fetal Tracing: Baseline: 130 bpm Variability: Moderate  Accelerations: 15x15 Decelerations: None Toco: None  MAU Course  Procedures None  MDM  Dr. Para March in to see the patient Prenatal panel ordered  Wet prep and Gc collected GBS Urine drug screen & urine culture OB detailed [place] ordered  Report given to Sharen Counter CNM who resumes care of the patient.  Leftwich-KirbyWilmer Floor, CNM   MDM:  [place] with dates c/w LMP dates, EFW 97% BPP with 6/8 for breathing Consult Dr Para March Given decreased fetal movement and 6/8 BPP at 39+ weeks, especially with limited prenatal care, will plan for delivery today Pt with hx cesarean and desires repeat so pt prepped for OR   Assessment and Plan   1. Decreased fetal movements in third trimester, single or unspecified fetus   2. No prenatal care in current pregnancy   3. Limited prenatal care in third trimester   4. [redacted] weeks gestation of pregnancy   5. Excessive fetal growth affecting management of pregnancy in third trimester, single or unspecified fetus   6. History of cesarean section complicating pregnancy   7. Blood type, Rh positive      Admit to OR for [facility] See H&P  Sharen Counter, CNM 11:59 PM

## 2021-08-06 NOTE — Anesthesia Preprocedure Evaluation (Signed)
Anesthesia Evaluation  Patient identified by MRN, date of birth, ID band Patient awake    Reviewed: Allergy & Precautions, NPO status , Patient's Chart, lab work & pertinent test results  Airway Mallampati: II  TM Distance: >3 FB Neck ROM: Full    Dental  (+) Dental Advisory Given   Pulmonary neg pulmonary ROS,    breath sounds clear to auscultation       Cardiovascular negative cardio ROS   Rhythm:Regular Rate:Normal     Neuro/Psych negative neurological ROS     GI/Hepatic negative GI ROS, Neg liver ROS,   Endo/Other  negative endocrine ROS  Renal/GU negative Renal ROS     Musculoskeletal   Abdominal   Peds  Hematology negative hematology ROS (+)   Anesthesia Other Findings   Reproductive/Obstetrics (+) Pregnancy                             Lab Results  Component Value Date   WBC 13.4 (H) 08/06/2021   HGB 11.5 (L) 08/06/2021   HCT 34.7 (L) 08/06/2021   MCV 92.3 08/06/2021   PLT 224 08/06/2021    Anesthesia Physical Anesthesia Plan  ASA: 2  Anesthesia Plan: Spinal   Post-op Pain Management:    Induction:   PONV Risk Score and Plan: 2 and Treatment may vary due to age or medical condition, Dexamethasone and Ondansetron  Airway Management Planned: Natural Airway  Additional Equipment: None  Intra-op Plan:   Post-operative Plan:   Informed Consent: I have reviewed the patients History and Physical, chart, labs and discussed the procedure including the risks, benefits and alternatives for the proposed anesthesia with the patient or authorized representative who has indicated his/her understanding and acceptance.       Plan Discussed with: CRNA  Anesthesia Plan Comments:         Anesthesia Quick Evaluation

## 2021-08-07 ENCOUNTER — Inpatient Hospital Stay (HOSPITAL_COMMUNITY): Payer: Medicaid Other | Admitting: Anesthesiology

## 2021-08-07 ENCOUNTER — Encounter (HOSPITAL_COMMUNITY): Payer: Self-pay | Admitting: Obstetrics and Gynecology

## 2021-08-07 ENCOUNTER — Encounter (HOSPITAL_COMMUNITY)
Admission: AD | Disposition: A | Discharge status: Home / Self Care | Payer: Self-pay | Source: Home / Self Care | Attending: Obstetrics and Gynecology

## 2021-08-07 DIAGNOSIS — O368190 Decreased fetal movements, unspecified trimester, not applicable or unspecified: Secondary | ICD-10-CM

## 2021-08-07 DIAGNOSIS — Z3A39 39 weeks gestation of pregnancy: Secondary | ICD-10-CM

## 2021-08-07 DIAGNOSIS — O0930 Supervision of pregnancy with insufficient antenatal care, unspecified trimester: Secondary | ICD-10-CM

## 2021-08-07 DIAGNOSIS — O34211 Maternal care for low transverse scar from previous cesarean delivery: Secondary | ICD-10-CM

## 2021-08-07 DIAGNOSIS — Z98891 History of uterine scar from previous surgery: Secondary | ICD-10-CM

## 2021-08-07 LAB — HEMOGLOBIN A1C
Hgb A1c MFr Bld: 5.3 % (ref 4.8–5.6)
Mean Plasma Glucose: 105.41 mg/dL

## 2021-08-07 LAB — HEPATITIS B SURFACE ANTIGEN: Hepatitis B Surface Ag: NONREACTIVE

## 2021-08-07 LAB — CBC
HCT: 27.9 % — ABNORMAL LOW (ref 36.0–46.0)
Hemoglobin: 9.5 g/dL — ABNORMAL LOW (ref 12.0–15.0)
MCH: 31.1 pg (ref 26.0–34.0)
MCHC: 34.1 g/dL (ref 30.0–36.0)
MCV: 91.5 fL (ref 80.0–100.0)
Platelets: 204 10*3/uL (ref 150–400)
RBC: 3.05 MIL/uL — ABNORMAL LOW (ref 3.87–5.11)
RDW: 12.8 % (ref 11.5–15.5)
WBC: 14.6 10*3/uL — ABNORMAL HIGH (ref 4.0–10.5)
nRBC: 0 % (ref 0.0–0.2)

## 2021-08-07 LAB — HEPATITIS C ANTIBODY: HCV Ab: NONREACTIVE

## 2021-08-07 LAB — RESP PANEL BY RT-PCR (FLU A&B, COVID) ARPGX2
Influenza A by PCR: NEGATIVE
Influenza B by PCR: NEGATIVE
SARS Coronavirus 2 by RT PCR: NEGATIVE

## 2021-08-07 LAB — RPR: RPR Ser Ql: NONREACTIVE

## 2021-08-07 SURGERY — Surgical Case
Anesthesia: Spinal

## 2021-08-07 MED ORDER — COCONUT OIL OIL: 1.0000 "application " | TOPICAL_OIL | Status: DC | PRN

## 2021-08-07 MED ORDER — NALOXONE HCL 0.4 MG/ML IJ SOLN: 0.4000 mg | INTRAMUSCULAR | Status: DC | PRN

## 2021-08-07 MED ORDER — SIMETHICONE 80 MG PO CHEW: 80.0000 mg | CHEWABLE_TABLET | ORAL | Status: DC | PRN

## 2021-08-07 MED ORDER — IBUPROFEN 600 MG PO TABS
600.0000 mg | ORAL_TABLET | Freq: Four times a day (QID) | ORAL | Status: DC
  Administered 2021-08-08 – 2021-08-09 (×5): 600 mg via ORAL
  Filled 2021-08-07 (×5): qty 1

## 2021-08-07 MED ORDER — OXYCODONE HCL 5 MG PO TABS: 5.0000 mg | ORAL_TABLET | ORAL | Status: DC | PRN

## 2021-08-07 MED ORDER — ONDANSETRON HCL 4 MG/2ML IJ SOLN
INTRAMUSCULAR | Status: AC
  Filled 2021-08-07: qty 2

## 2021-08-07 MED ORDER — SCOPOLAMINE 1 MG/3DAYS TD PT72
1.0000 | MEDICATED_PATCH | Freq: Once | TRANSDERMAL | Status: DC
  Administered 2021-08-07: 1.5 mg via TRANSDERMAL

## 2021-08-07 MED ORDER — DIPHENHYDRAMINE HCL 50 MG/ML IJ SOLN: 12.5000 mg | INTRAMUSCULAR | Status: DC | PRN

## 2021-08-07 MED ORDER — DIBUCAINE (PERIANAL) 1 % EX OINT: 1.0000 "application " | TOPICAL_OINTMENT | CUTANEOUS | Status: DC | PRN

## 2021-08-07 MED ORDER — PHENYLEPHRINE HCL-NACL 20-0.9 MG/250ML-% IV SOLN
INTRAVENOUS | Status: DC | PRN
  Administered 2021-08-07: 50 ug/min via INTRAVENOUS

## 2021-08-07 MED ORDER — NALBUPHINE HCL 10 MG/ML IJ SOLN: 5.0000 mg | INTRAMUSCULAR | Status: DC | PRN

## 2021-08-07 MED ORDER — HYDROMORPHONE HCL 1 MG/ML IJ SOLN: 0.2000 mg | INTRAMUSCULAR | Status: DC | PRN

## 2021-08-07 MED ORDER — STERILE WATER FOR IRRIGATION IR SOLN
Status: DC | PRN
  Administered 2021-08-07: 1

## 2021-08-07 MED ORDER — EPHEDRINE 5 MG/ML INJ
INTRAVENOUS | Status: AC
  Filled 2021-08-07: qty 5

## 2021-08-07 MED ORDER — KETOROLAC TROMETHAMINE 30 MG/ML IJ SOLN
30.0000 mg | Freq: Four times a day (QID) | INTRAMUSCULAR | Status: DC | PRN
  Administered 2021-08-07: 30 mg via INTRAMUSCULAR

## 2021-08-07 MED ORDER — OXYTOCIN-SODIUM CHLORIDE 30-0.9 UT/500ML-% IV SOLN
INTRAVENOUS | Status: DC | PRN
  Administered 2021-08-07: 300 mL via INTRAVENOUS

## 2021-08-07 MED ORDER — FENTANYL CITRATE (PF) 100 MCG/2ML IJ SOLN
INTRAMUSCULAR | Status: AC
  Filled 2021-08-07: qty 2

## 2021-08-07 MED ORDER — ACETAMINOPHEN 500 MG PO TABS
1000.0000 mg | ORAL_TABLET | Freq: Four times a day (QID) | ORAL | Status: AC
  Administered 2021-08-07 (×4): 1000 mg via ORAL
  Filled 2021-08-07 (×4): qty 2

## 2021-08-07 MED ORDER — DIPHENHYDRAMINE HCL 25 MG PO CAPS: 25.0000 mg | ORAL_CAPSULE | ORAL | Status: DC | PRN

## 2021-08-07 MED ORDER — BUPIVACAINE HCL (PF) 0.5 % IJ SOLN
INTRAMUSCULAR | Status: AC
  Filled 2021-08-07: qty 30

## 2021-08-07 MED ORDER — KETOROLAC TROMETHAMINE 30 MG/ML IJ SOLN: 30.0000 mg | Freq: Once | INTRAMUSCULAR | Status: DC

## 2021-08-07 MED ORDER — FENTANYL CITRATE (PF) 100 MCG/2ML IJ SOLN
INTRAMUSCULAR | Status: DC | PRN
  Administered 2021-08-07: 15 ug via INTRATHECAL

## 2021-08-07 MED ORDER — NALBUPHINE HCL 10 MG/ML IJ SOLN: 5.0000 mg | Freq: Once | INTRAMUSCULAR | Status: DC | PRN

## 2021-08-07 MED ORDER — PHENYLEPHRINE HCL (PRESSORS) 10 MG/ML IV SOLN
INTRAVENOUS | Status: DC | PRN
  Administered 2021-08-07 (×5): 80 ug via INTRAVENOUS

## 2021-08-07 MED ORDER — BUPIVACAINE IN DEXTROSE 0.75-8.25 % IT SOLN
INTRATHECAL | Status: DC | PRN
  Administered 2021-08-07: 1.6 mL via INTRATHECAL

## 2021-08-07 MED ORDER — SODIUM CHLORIDE 0.9 % IR SOLN
Status: DC | PRN
  Administered 2021-08-07: 1

## 2021-08-07 MED ORDER — SODIUM CHLORIDE 0.9% FLUSH: 3.0000 mL | INTRAVENOUS | Status: DC | PRN

## 2021-08-07 MED ORDER — KETOROLAC TROMETHAMINE 30 MG/ML IJ SOLN
30.0000 mg | Freq: Four times a day (QID) | INTRAMUSCULAR | Status: AC
  Administered 2021-08-07 – 2021-08-08 (×4): 30 mg via INTRAVENOUS
  Filled 2021-08-07 (×4): qty 1

## 2021-08-07 MED ORDER — SCOPOLAMINE 1 MG/3DAYS TD PT72
MEDICATED_PATCH | TRANSDERMAL | Status: AC
  Filled 2021-08-07: qty 1

## 2021-08-07 MED ORDER — PHENYLEPHRINE 40 MCG/ML (10ML) SYRINGE FOR IV PUSH (FOR BLOOD PRESSURE SUPPORT)
PREFILLED_SYRINGE | INTRAVENOUS | Status: AC
  Filled 2021-08-07: qty 10

## 2021-08-07 MED ORDER — WITCH HAZEL-GLYCERIN EX PADS: 1.0000 "application " | MEDICATED_PAD | CUTANEOUS | Status: DC | PRN

## 2021-08-07 MED ORDER — MENTHOL 3 MG MT LOZG: 1.0000 | LOZENGE | OROMUCOSAL | Status: DC | PRN

## 2021-08-07 MED ORDER — ENOXAPARIN SODIUM 40 MG/0.4ML IJ SOSY
40.0000 mg | PREFILLED_SYRINGE | INTRAMUSCULAR | Status: DC
  Administered 2021-08-08: 40 mg via SUBCUTANEOUS
  Filled 2021-08-07 (×2): qty 0.4

## 2021-08-07 MED ORDER — MEPERIDINE HCL 25 MG/ML IJ SOLN: 6.2500 mg | INTRAMUSCULAR | Status: DC | PRN

## 2021-08-07 MED ORDER — OXYTOCIN-SODIUM CHLORIDE 30-0.9 UT/500ML-% IV SOLN: 2.5000 [IU]/h | INTRAVENOUS | Status: AC

## 2021-08-07 MED ORDER — EPHEDRINE SULFATE 50 MG/ML IJ SOLN
INTRAMUSCULAR | Status: DC | PRN
  Administered 2021-08-07: 10 mg via INTRAVENOUS

## 2021-08-07 MED ORDER — ONDANSETRON HCL 4 MG/2ML IJ SOLN: 4.0000 mg | Freq: Three times a day (TID) | INTRAMUSCULAR | Status: DC | PRN

## 2021-08-07 MED ORDER — KETOROLAC TROMETHAMINE 30 MG/ML IJ SOLN: 30.0000 mg | Freq: Four times a day (QID) | INTRAMUSCULAR | Status: DC | PRN

## 2021-08-07 MED ORDER — PHENYLEPHRINE HCL-NACL 20-0.9 MG/250ML-% IV SOLN
INTRAVENOUS | Status: AC
  Filled 2021-08-07: qty 250

## 2021-08-07 MED ORDER — FENTANYL CITRATE (PF) 100 MCG/2ML IJ SOLN: 25.0000 ug | INTRAMUSCULAR | Status: DC | PRN

## 2021-08-07 MED ORDER — MORPHINE SULFATE (PF) 0.5 MG/ML IJ SOLN
INTRAMUSCULAR | Status: AC
  Filled 2021-08-07: qty 10

## 2021-08-07 MED ORDER — MORPHINE SULFATE (PF) 0.5 MG/ML IJ SOLN
INTRAMUSCULAR | Status: DC | PRN
  Administered 2021-08-07: 150 ug via INTRATHECAL

## 2021-08-07 MED ORDER — KETOROLAC TROMETHAMINE 30 MG/ML IJ SOLN
INTRAMUSCULAR | Status: AC
  Filled 2021-08-07: qty 1

## 2021-08-07 MED ORDER — NALOXONE HCL 4 MG/10ML IJ SOLN
1.0000 ug/kg/h | INTRAVENOUS | Status: DC | PRN
  Filled 2021-08-07: qty 5

## 2021-08-07 MED ORDER — ONDANSETRON HCL 4 MG/2ML IJ SOLN
INTRAMUSCULAR | Status: DC | PRN
  Administered 2021-08-07: 4 mg via INTRAVENOUS

## 2021-08-07 MED ORDER — SIMETHICONE 80 MG PO CHEW
80.0000 mg | CHEWABLE_TABLET | Freq: Three times a day (TID) | ORAL | Status: DC
  Administered 2021-08-07 – 2021-08-09 (×8): 80 mg via ORAL
  Filled 2021-08-07 (×7): qty 1

## 2021-08-07 MED ORDER — LACTATED RINGERS IV SOLN: INTRAVENOUS | Status: DC | PRN

## 2021-08-07 MED ORDER — OXYTOCIN-SODIUM CHLORIDE 30-0.9 UT/500ML-% IV SOLN
INTRAVENOUS | Status: AC
  Filled 2021-08-07: qty 500

## 2021-08-07 MED ORDER — TETANUS-DIPHTH-ACELL PERTUSSIS 5-2.5-18.5 LF-MCG/0.5 IM SUSY: 0.5000 mL | PREFILLED_SYRINGE | Freq: Once | INTRAMUSCULAR | Status: DC

## 2021-08-07 SURGICAL SUPPLY — 30 items
BENZOIN TINCTURE PRP APPL 2/3 (GAUZE/BANDAGES/DRESSINGS) ×2 IMPLANT
CHLORAPREP W/TINT 26ML (MISCELLANEOUS) ×2 IMPLANT
CLAMP CORD UMBIL (MISCELLANEOUS) IMPLANT
CLOTH BEACON ORANGE TIMEOUT ST (SAFETY) ×2 IMPLANT
DRSG OPSITE POSTOP 4X10 (GAUZE/BANDAGES/DRESSINGS) ×2 IMPLANT
ELECT REM PT RETURN 9FT ADLT (ELECTROSURGICAL) ×2
ELECTRODE REM PT RTRN 9FT ADLT (ELECTROSURGICAL) ×1 IMPLANT
EXTRACTOR VACUUM KIWI (MISCELLANEOUS) IMPLANT
GLOVE SURG ENC MOIS LTX SZ6 (GLOVE) ×2 IMPLANT
GLOVE SURG UNDER POLY LF SZ7 (GLOVE) ×4 IMPLANT
GOWN STRL REUS W/TWL LRG LVL3 (GOWN DISPOSABLE) ×4 IMPLANT
KIT ABG SYR 3ML LUER SLIP (SYRINGE) IMPLANT
NEEDLE HYPO 22GX1.5 SAFETY (NEEDLE) IMPLANT
NEEDLE HYPO 25X5/8 SAFETYGLIDE (NEEDLE) IMPLANT
NS IRRIG 1000ML POUR BTL (IV SOLUTION) ×2 IMPLANT
PACK C SECTION WH (CUSTOM PROCEDURE TRAY) ×2 IMPLANT
PAD OB MATERNITY 4.3X12.25 (PERSONAL CARE ITEMS) ×2 IMPLANT
PENCIL SMOKE EVAC W/HOLSTER (ELECTROSURGICAL) ×2 IMPLANT
RETRACTOR WND ALEXIS 25 LRG (MISCELLANEOUS) IMPLANT
RTRCTR WOUND ALEXIS 25CM LRG (MISCELLANEOUS)
STRIP CLOSURE SKIN 1/2X4 (GAUZE/BANDAGES/DRESSINGS) ×2 IMPLANT
SUT PLAIN 2 0 XLH (SUTURE) ×2 IMPLANT
SUT VIC AB 0 CT1 36 (SUTURE) ×6 IMPLANT
SUT VIC AB 2-0 CT1 27 (SUTURE) ×1
SUT VIC AB 2-0 CT1 TAPERPNT 27 (SUTURE) ×1 IMPLANT
SUT VIC AB 4-0 KS 27 (SUTURE) ×2 IMPLANT
SYR CONTROL 10ML LL (SYRINGE) IMPLANT
TOWEL OR 17X24 6PK STRL BLUE (TOWEL DISPOSABLE) ×2 IMPLANT
TRAY FOLEY W/BAG SLVR 14FR LF (SET/KITS/TRAYS/PACK) IMPLANT
WATER STERILE IRR 1000ML POUR (IV SOLUTION) ×2 IMPLANT

## 2021-08-07 NOTE — Discharge Summary (Signed)
   Postpartum Discharge Summary     Patient Name: Destiny Cox DOB: 02/07/1996 MRN: [id number]  Date of admission: 08/06/2021 Delivery date:08/07/2021  Delivering provider: DUNCAN, PAULA  Date of discharge: 08/09/2021  Admitting diagnosis: Decreased fetal movement [O36.8190] Intrauterine pregnancy: [redacted]w[redacted]d     Secondary diagnosis:  Principal Problem:   Status post repeat low transverse cesarean section Active Problems:   Decreased fetal movement   No prenatal care in current pregnancy  Additional problems: None     Discharge diagnosis: Term Pregnancy Delivered                                              Post partum procedures: IV Venofer infusion Augmentation: N/A Complications: None  Hospital course: Sceduled C/S   25 y.o. yo G2P2002 at [redacted]w[redacted]d was admitted to the hospital 08/06/2021 for scheduled cesarean section with the following indication: Decreased fetal movement at term with BPP 6 out of 8 . Delivery details are as follows:  Membrane Rupture Time/Date: 1:20 AM ,08/07/2021   Delivery Method:C-Section, Low Transverse  Details of operation can be found in separate operative note.  Patient had an uncomplicated postpartum course.  She is ambulating, tolerating a regular diet, passing flatus, and urinating well. Patient is discharged home in stable condition on  08/09/21        Newborn Data: Birth date:08/07/2021  Birth time:1:21 AM  Gender:Female  Living status:Living  Apgars:9 ,9  Weight:4280 g     Magnesium Sulfate received: No BMZ received: No Rhophylac: N/A MMR: N/A - Immune  T-DaP: Offered postpartum Flu: No Transfusion: No  Physical exam  Vitals:   08/08/21 0607 08/08/21 1400 08/08/21 2115 08/09/21 0523  BP: 110/60 128/71 113/77 110/64  Pulse: 70 73 87 67  Resp: 16 15 17 16  Temp: 98.2 F (36.8 C) 98.6 F (37 C) 98.7 F (37.1 C) 98 F (36.7 C)  TempSrc: Oral Oral Oral Oral  SpO2: 99% 100% 99%   Weight:      Height:       General: alert, cooperative,  and no distress Lochia: appropriate Uterine Fundus: firm and below umbilicus  Incision: Healing well with no significant drainage, No significant erythema, Dressing is clean, dry, and intact DVT Evaluation: no LE edema or calf tenderness to palpation   Labs: Lab Results  Component Value Date   WBC 14.6 (H) 08/07/2021   HGB 9.5 (L) 08/07/2021   HCT 27.9 (L) 08/07/2021   MCV 91.5 08/07/2021   PLT 204 08/07/2021   No flowsheet data found. Edinburgh Score: Edinburgh Postnatal Depression Scale Screening Tool 08/07/2021  I have been able to laugh and see the funny side of things. (No Data)     After visit meds:  Allergies as of 08/09/2021       Reactions   Red Dye Hives        Medication List     TAKE these medications    ibuprofen 600 MG tablet Commonly known as: ADVIL Take 1 tablet (600 mg total) by mouth every 6 (six) hours as needed (pain).   norethindrone 0.35 MG tablet Commonly known as: MICRONOR Take 1 tablet (0.35 mg total) by mouth daily.   oxyCODONE 5 MG immediate release tablet Commonly known as: Roxicodone Take 1 tablet (5 mg total) by mouth every 6 (six) hours as needed for severe pain or breakthrough pain.           Discharge home in stable condition Infant Feeding: Bottle Infant Disposition:home with mother Discharge instruction: per After Visit Summary and Postpartum booklet. Activity: Advance as tolerated. Pelvic rest for 6 weeks.  Diet: routine diet Future Appointments: Future Appointments  Date Time Provider Department Center  09/21/2021  2:15 PM Stinson, Jacob J, DO [facility]   Follow up Visit: Patient had no prenatal care this pregnancy. Message sent to [facility] for postpartum follow up.   Please schedule this patient for a In person postpartum visit in 6 weeks with the following provider: Any provider. Additional Postpartum F/U: None   Low risk pregnancy complicated by:  No prenatal care, Hx of cesarean delivery Delivery mode:   C-Section, Low Transverse  Anticipated Birth Control:  POPs  08/09/2021 Christina M Albert, MD    

## 2021-08-07 NOTE — Anesthesia Procedure Notes (Signed)
Spinal  Patient location during procedure: OR Start time: 08/07/2021 12:52 AM End time: 08/07/2021 12:57 AM Reason for block: surgical anesthesia Staffing Performed: anesthesiologist  Anesthesiologist: Marcene Duos, MD Preanesthetic Checklist Completed: patient identified, IV checked, site marked, risks and benefits discussed, surgical consent, monitors and equipment checked, pre-op evaluation and timeout performed Spinal Block Patient position: sitting Prep: DuraPrep Patient monitoring: heart rate, cardiac monitor, continuous pulse ox and blood pressure Approach: midline Location: L4-5 Injection technique: single-shot Needle Needle type: Pencan  Needle gauge: 24 G Needle length: 9 cm Assessment Sensory level: T4 Events: CSF return

## 2021-08-07 NOTE — Anesthesia Postprocedure Evaluation (Signed)
Anesthesia Post Note  Patient: Destiny Cox  Procedure(s) Performed: CESAREAN SECTION     Patient location during evaluation: PACU Anesthesia Type: Spinal Level of consciousness: awake and alert Pain management: pain level controlled Vital Signs Assessment: post-procedure vital signs reviewed and stable Respiratory status: spontaneous breathing and respiratory function stable Cardiovascular status: blood pressure returned to baseline and stable Postop Assessment: spinal receding Anesthetic complications: no   No notable events documented.  Last Vitals:  Vitals:   08/07/21 0329 08/07/21 0426  BP: 111/66 109/69  Pulse: 80 85  Resp: 16 18  Temp: 36.8 C 36.7 C  SpO2: 100% 98%    Last Pain:  Vitals:   08/07/21 0426  TempSrc: Oral  PainSc: 3    Pain Goal: Patients Stated Pain Goal: 0 (08/07/21 0000)              Epidural/Spinal Function Cutaneous sensation: Tingles (08/07/21 0426), Patient able to flex knees: Yes (08/07/21 0426), Patient able to lift hips off bed: Yes (08/07/21 0426), Back pain beyond tenderness at insertion site: No (08/07/21 0426), Progressively worsening motor and/or sensory loss: No (08/07/21 0426), Bowel and/or bladder incontinence post epidural: No (08/07/21 0426)  Kennieth Rad

## 2021-08-07 NOTE — Transfer of Care (Signed)
Immediate Anesthesia Transfer of Care Note  Patient: Destiny Cox  Procedure(s) Performed: CESAREAN SECTION  Patient Location: PACU  Anesthesia Type:Spinal  Level of Consciousness: awake, alert  and oriented  Airway & Oxygen Therapy: Patient Spontanous Breathing  Post-op Assessment: Report given to RN  Post vital signs: Reviewed  Last Vitals:  Vitals Value Taken Time  BP 109/69 08/07/21 0426  Temp 36.7 C 08/07/21 0426  Pulse 85 08/07/21 0426  Resp 18 08/07/21 0426  SpO2 98 % 08/07/21 0426    Last Pain:  Vitals:   08/07/21 0426  TempSrc: Oral  PainSc: 3       Patients Stated Pain Goal: 0 (08/07/21 0000)  Complications: No notable events documented.

## 2021-08-07 NOTE — Op Note (Signed)
Destiny Cox  PROCEDURE DATE: 08/07/2021  PREOPERATIVE DIAGNOSES: Intrauterine pregnancy at [redacted]w[redacted]d weeks gestation;  decreased fetal movement at term; BPP 6/8; history of prior cesarean delivery  POSTOPERATIVE DIAGNOSES: The same  PROCEDURE: Repeat Low Transverse Cesarean Section  SURGEON:  Dr. Milas Hock  ASSISTANT:  Dr. Evalina Field  ANESTHESIOLOGY TEAM: Anesthesiologist: Marcene Duos, MD CRNA: Wilder Glade, CRNA  INDICATIONS: Destiny Cox is a 25 y.o. [phone] at [redacted]w[redacted]d here for cesarean section secondary to the indications listed under preoperative diagnoses; please see preoperative note for further details.  The risks of surgery were discussed with the patient including but were not limited to: bleeding which may require transfusion or reoperation; infection which may require antibiotics; injury to bowel, bladder, ureters or other surrounding organs; injury to the fetus; need for additional procedures including hysterectomy in the event of a life-threatening hemorrhage; formation of adhesions; placental abnormalities wth subsequent pregnancies; incisional problems; thromboembolic phenomenon and other postoperative/anesthesia complications.  The patient concurred with the proposed plan, giving informed written consent for the procedure.    FINDINGS:  Viable female infant in cephalic presentation.  Apgars 9 and 9.  Clear amniotic fluid.  Intact placenta, three vessel cord.  Normal uterus. Did not fully visualize bilateral fallopian tubes and ovaries. Minimal to no adhesive disease.   ANESTHESIA: Spinal INTRAVENOUS FLUIDS: 1600 ml   ESTIMATED BLOOD LOSS: 232 ml URINE OUTPUT: 50 ml SPECIMENS: Placenta sent to L&D COMPLICATIONS: None immediate  PROCEDURE IN DETAIL:  The patient preoperatively received intravenous antibiotics and had sequential compression devices applied to her lower extremities.  She was then taken to the operating room where spinal anesthesia was administered  and found to be adequate. She was then placed in a dorsal supine position with a leftward tilt, and prepped and draped in a sterile manner.  A foley catheter was placed into her bladder and attached to constant gravity.    After an adequate timeout was performed, a Pfannenstiel skin incision was made with scalpel on her preexisting scar and carried through to the underlying layer of fascia. The fascia was incised in the midline, and this incision was extended bilaterally using the Mayo scissors.  Kocher clamps were applied to the superior aspect of the fascial incision and the underlying rectus muscles were dissected off bluntly and sharply.  A similar process was carried out on the inferior aspect of the fascial incision. The rectus muscles were separated in the midline and the peritoneum was entered bluntly. The Alexis self-retaining retractor was introduced into the abdominal cavity.  Attention was turned to the lower uterine segment where a low transverse hysterotomy was made with a scalpel and extended bilaterally bluntly.  The infant was successfully delivered, the cord was clamped and cut after one minute, and the infant was handed over to the awaiting neonatology team. Uterine massage was then administered, and the placenta delivered intact with a three-vessel cord. The uterus was then cleared of clots and debris.    The hysterotomy was closed with 0 Vicryl in a running locked fashion, and an imbricating layer was also placed with 0 Vicryl. Incision inspected and hemostatic.The pelvis was cleared of all clot and debris. The retractor was removed.  The peritoneum was closed with a 0 Vicryl running stitch. The fascia was then closed using 0 Vicryl in a running fashion.  The subcutaneous layer was irrigated and everything was hemostatic. Due to depth of subcutaneous tissue, it was re-approximated with 2-0 plain gut in a running fashion. The skin  was closed with a 4-0 Vicryl subcuticular stitch. The patient  tolerated the procedure well. Sponge, instrument and needle counts were correct x 3.  She was taken to the recovery room in stable condition.   Evalina Field, MD OB Fellow  Faculty Practice

## 2021-08-08 ENCOUNTER — Encounter (HOSPITAL_COMMUNITY): Payer: Self-pay | Admitting: Obstetrics and Gynecology

## 2021-08-08 LAB — RUBELLA SCREEN: Rubella: 1.44 index (ref >0.99)

## 2021-08-08 LAB — CULTURE, BETA STREP (GROUP B ONLY)

## 2021-08-08 LAB — GC/CHLAMYDIA PROBE AMP (~~LOC~~) NOT AT ARMC
Chlamydia: NEGATIVE
Comment: NEGATIVE
Comment: NORMAL
Neisseria Gonorrhea: NEGATIVE

## 2021-08-08 MED ORDER — SODIUM CHLORIDE 0.9 % IV SOLN
500.0000 mg | Freq: Once | INTRAVENOUS | Status: AC
  Administered 2021-08-08: 500 mg via INTRAVENOUS
  Filled 2021-08-08: qty 25

## 2021-08-08 MED ORDER — GABAPENTIN 600 MG PO TABS
300.0000 mg | ORAL_TABLET | Freq: Every day | ORAL | Status: DC
  Administered 2021-08-08: 300 mg via ORAL
  Filled 2021-08-08 (×2): qty 0.5

## 2021-08-08 NOTE — Progress Notes (Signed)
Post Partum Day 1 from [facility]LTCS Subjective: She is feeling well but endorses feeling tired and fatigued. She is having some burning incisional pain at 3/10 intensity. She is ambulating and urinating without difficulty, tolerating oral intake, and passing gas. She does not endorse any chest pain or difficulty breathing.  Objective: Blood pressure 110/60, pulse 70, temperature 98.2 F (36.8 C), temperature source Oral, resp. rate 16, height 5\' 3"  (1.6 m), weight 93.4 kg, SpO2 99 %, unknown if currently breastfeeding.  Physical Exam:  General: alert, cooperative, and no distress Lochia: appropriate Uterine Fundus: firm Incision: clean, dry, and intact DVT Evaluation: No evidence of DVT seen on physical exam.  Recent Labs    08/06/21 1932 08/07/21 0800  HGB 11.5* 9.5*  HCT 34.7* 27.9*    Assessment/Plan: Destiny Cox is a 25 year old G2P2002 on POD#1 from rLTCS who is recovering well and meeting all goals. -F/u GBS status -Consider IV iron today -Anticipate discharge tomorrow   LOS: 2 days   22 08/08/2021, 7:14 AM

## 2021-08-08 NOTE — Clinical Social Work Maternal (Addendum)
CLINICAL SOCIAL WORK MATERNAL/CHILD NOTE  Patient Details  Name: Destiny Cox MRN: [id number] Date of Birth: [birth year]  Date:  08/08/2021  Clinical Social Worker Initiating Note:  Kathrin Greathouse, [place] Date/Time: Initiated:  08/08/21/1130     Child's Name:  Edison Simon   Biological Parents:  Mother, Father (MOB Destiny Cox 1995[birth year]06 FOB: Destiny Cox 01-21-1992)   Need for Interpreter:  None   Reason for Referral:  Late or No Prenatal Care     Address:  [address] [place] [address]    Phone number:  [phone] (home)     Additional phone number:   Household Members/Support Persons (HM/SP):   Household Member/Support Person 1, Household Member/Support Person 2   HM/SP Name Relationship DOB or Age  HM/SP -[place] Spouse [phone]  HM/SP -2 [place] Son 5  HM/SP -3        HM/SP -4        HM/SP -5        HM/SP -6        HM/SP -7        HM/SP -8          Natural Supports (not living in the home):  Extended Family   Professional Supports: None   Employment: Full-time   Type of Work: [place] [employer]   Education:  Attending college   Homebound arranged:    Museum/gallery curator Resources:  Medicaid   Other Resources:      Cultural/Religious Considerations Which May Impact Care:    Strengths:  Ability to meet basic needs  , Home prepared for child     Psychotropic Medications:         Pediatrician:       Pediatrician List:   [facility]      Pediatrician Fax Number:    Risk Factors/Current Problems:      Cognitive State:  Alert  , Linear Thinking  , Able to Concentrate     Mood/Affect:  Calm  , Comfortable  , Bright     CSW Assessment:  CSW received consult for hx No [facility] records. CSW met with MOB to offer support and complete assessment.    CSW met with MOB at bedside and introduced CSW role. CSW observed MOB lying in  the bed, infant asleep in bassinet and FOB sitting in the recliner. CSW congratulated MOB and FOB. MOB presented calm and welcomed CSW visit. CSW offered MOB privacy, and FOB left to allow privacy. MOB confirmed the demographic information on hospital file is correct. MOB reported she, FOB and her five-year-old son live together (see chart above). MOB identifies FOB as her primary support. MOB shared that she and FOB are planning to move to [place] soon (March 2023) so that she can have extra support from her parents while she completes her Premed degree at [employer] and applies for medical school. CSW praised MOB for her efforts. CSW inquired about MOB PNC/records. MOB reported she received PNC at [facility] however she could not recall the name of the doctor or NP she saw. MOB reported she initiated care in July. MOB shared she did not like the services she received at [facility] [employer] so her insurance recommended Dr. Garwin Brothers however MOB shared she did not establish care with Dr. Garwin Brothers. CSW informed MOB about the hospital drug screen policy.  MOB informed that CSW will monitor the infant CDS and make a report to CPS, if warranted. MOB reported understanding and had no questions. MOB denied CPS history.   CSW inquired how MOB has felt emotionally since giving birth. MOB reported she has felt great, a lot better than her last L&D. CSW inquired how MOB felt emotionally during the pregnancy. MOB reported feeling good with no concerns. MOB reported no history of mental health and PPD. CSW provided education regarding the baby blues period vs. perinatal mood disorders, discussed treatment and gave resources for mental health follow up if concerns arise.  CSW recommended MOB complete a self-evaluation during the postpartum time period using the New Mom Checklist from Postpartum Progress and encouraged MOB to contact a medical professional if symptoms are noted at any time. MOB reported if she  has concerns, she will reach out to a medical professional.   CSW provided review of Sudden Infant Death Syndrome (SIDS) precautions. MOB reported she has essential items for the infant including a car seat and a bassinet where the infant will sleep. MOB is deciding on a pediatrician for the infant's follow up care. MOB reported she will have transportation to the appointments. MOB reported she in not interested in applying for WIC/FS at this time however she was receptive to the information, if she changes her mind. CSW assessed MOB for additional needs. MOB reported no further needs.   CSW identifies no further need for intervention and no barriers to discharge at this time.   CSW Plan/Description:  Sudden Infant Death Syndrome (SIDS) Education, CSW Will Continue to Monitor Umbilical Cord Tissue Drug Screen Results and Make Report if Warranted, Perinatal Mood and Anxiety Disorder (PMADs) Education, No Further Intervention Required/No Barriers to Discharge, [place], LCSW 08/08/2021, 12:14 PM

## 2021-08-09 MED ORDER — NORETHINDRONE 0.35 MG PO TABS: 1.0000 | ORAL_TABLET | Freq: Every day | ORAL | 5 refills | Status: DC

## 2021-08-09 MED ORDER — OXYCODONE HCL 5 MG PO TABS: 5.0000 mg | ORAL_TABLET | Freq: Four times a day (QID) | ORAL | 0 refills | Status: DC | PRN

## 2021-08-09 MED ORDER — IBUPROFEN 600 MG PO TABS: 600.0000 mg | ORAL_TABLET | Freq: Four times a day (QID) | ORAL | 0 refills | Status: DC | PRN

## 2021-08-09 NOTE — Discharge Instructions (Signed)
You may start taking your birth control pills (Micron[employer]) at any time. There may be a small amount of red dye in the medication. If you tolerate some red dye products, I doubt that this will be a problem for you. However, if you have any reaction from the medication, please call our office and we can try to think of an alternative method for you.

## 2021-08-09 NOTE — Social Work (Signed)
CSW reached out to DHHS. Per [place] [employer]HS, MOB does not have an open CPS case.   Zali Kamaka, MSW, LCSW [facility]  Clinical Social Worker  [phone] 08/09/2021  9:35 AM   

## 2021-08-15 ENCOUNTER — Other Ambulatory Visit: Payer: Self-pay | Admitting: Family Medicine

## 2021-08-19 ENCOUNTER — Telehealth (HOSPITAL_COMMUNITY): Payer: Self-pay | Admitting: *Deleted

## 2021-08-19 NOTE — Telephone Encounter (Signed)
Attempted hospital discharge follow-up call. Left message for patient to return RN call. Deforest Hoyles, RN, 08/19/21, [phone]

## 2021-09-21 ENCOUNTER — Ambulatory Visit: Payer: Medicaid Other | Admitting: Family Medicine

## 2021-09-27 ENCOUNTER — Other Ambulatory Visit: Payer: Self-pay

## 2021-09-27 ENCOUNTER — Encounter: Payer: Self-pay | Admitting: Family Medicine

## 2021-09-27 ENCOUNTER — Ambulatory Visit: Payer: Medicaid Other | Admitting: Student

## 2021-09-27 ENCOUNTER — Ambulatory Visit (INDEPENDENT_AMBULATORY_CARE_PROVIDER_SITE_OTHER): Payer: Medicaid Other | Admitting: Family Medicine

## 2021-09-27 MED ORDER — NORETHINDRONE 0.35 MG PO TABS: 1.0000 | ORAL_TABLET | Freq: Every day | ORAL | 11 refills | Status: AC

## 2021-09-27 NOTE — Progress Notes (Signed)
[place] Partum Visit Note  Destiny Cox is a 26 y.o. G9P2002 female who presents for a postpartum visit. She is 7 weeks postpartum following a repeat cesarean section.  I have fully reviewed the prenatal and intrapartum course. The delivery was at 39.1 gestational weeks.  Anesthesia: spinal. Postpartum course has been uneventful. Baby is doing well. Baby is feeding by bottle - Enfamil Gentle . Bleeding no bleeding. Bowel function is normal. Bladder function is normal. Patient is not sexually active. Contraception method is oral progesterone-only contraceptive. Postpartum depression screening: negative.   The pregnancy intention screening data noted above was reviewed. Potential methods of contraception were discussed. The patient elected to proceed with No data recorded.   Edinburgh Postnatal Depression Scale - 09/27/21 1358       Edinburgh Postnatal Depression Scale:  In the Past 7 Days   I have been able to laugh and see the funny side of things. 0    I have looked forward with enjoyment to things. 0    I have blamed myself unnecessarily when things went wrong. 0    I have been anxious or worried for no good reason. 0    I have felt scared or panicky for no good reason. 0    Things have been getting on top of me. 0    I have been so unhappy that I have had difficulty sleeping. 0    I have felt sad or miserable. 0    I have been so unhappy that I have been crying. 0    The thought of harming myself has occurred to me. 0    Edinburgh Postnatal Depression Scale Total 0             Health Maintenance Due  Topic Date Due   COVID-19 Vaccine (1) Never done   HPV VACCINES (3 - 3-dose series) 12/24/2017   INFLUENZA VACCINE  04/25/2021    The following portions of the patient's history were reviewed and updated as appropriate: allergies, current medications, past family history, past medical history, past social history, past surgical history, and problem list.  Review of  Systems Pertinent items noted in HPI and remainder of comprehensive ROS otherwise negative.  Objective:  BP 123/80    Pulse (!) 118    Wt 189 lb (85.7 kg)    Breastfeeding No    BMI 33.48 kg/m    General:  alert, cooperative, and appears stated age   Breasts:  not indicated  Lungs: Comfortable on room air  Wound well approximated incision  GU exam:  not indicated       Assessment:    There are no diagnoses linked to this encounter.  Normal postpartum exam.   Plan:   Essential components of care per ACOG recommendations:  1.  Mood and well being: Patient with negative depression screening today. Reviewed local resources for support.  - Patient tobacco use? No.   - hx of drug use? No.    2. Infant care and feeding:  -Patient currently breastmilk feeding? No.  -Social determinants of health (SDOH) reviewed in EPIC. No concerns  3. Sexuality, contraception and birth spacing - Patient does not want a pregnancy in the next year.  Desired family size is unsure number of children.  - Reviewed forms of contraception in tiered fashion. Patient desired oral progesterone-only contraceptive today.   - Discussed birth spacing of 18 months  4. Sleep and fatigue -Encouraged family/partner/community support of 4 hrs of uninterrupted sleep  to help with mood and fatigue  5. Physical Recovery  - Discussed patients delivery and complications. She describes her labor as good. - Patient had a C-section repeat; no problems after deliver.  - Patient has urinary incontinence? No. - Patient is safe to resume physical and sexual activity  6.  Health Maintenance - HM due items addressed No - up to date - Last pap smear  Diagnosis  Date Value Ref Range Status  06/03/2019   Final   NEGATIVE FOR INTRAEPITHELIAL LESIONS OR MALIGNANCY.   Pap smear not done at today's visit.  -Breast Cancer screening indicated? No.   7. Chronic Disease/Pregnancy Condition follow up: None  - PCP follow  up  Clarnce Flock, MD [facility], [place]

## 2022-01-19 ENCOUNTER — Inpatient Hospital Stay
Admit: 2022-01-19 | Discharge: 2022-01-19 | Disposition: A | Discharge status: Home / Self Care | Payer: Medicaid - Out of State | Attending: Emergency Medicine

## 2022-01-19 ENCOUNTER — Emergency Department: Admit: 2022-01-19 | Payer: Medicaid - Out of State

## 2022-01-19 DIAGNOSIS — S8001XA Contusion of right knee, initial encounter: Secondary | ICD-10-CM

## 2022-01-19 DIAGNOSIS — O9A211 Injury, poisoning and certain other consequences of external causes complicating pregnancy, first trimester: Secondary | ICD-10-CM

## 2022-01-19 LAB — HCG URINE, QL
HCG urine, QL: POSITIVE — AB
Pregnancy Test(Urn): POSITIVE — AB

## 2022-01-19 LAB — BETA HCG, QT
HCG,INTACT, THCGA1: 19633 m[IU]/mL — ABNORMAL HIGH (ref 0.0–6.0)
HCG,INTACT: 19633 m[IU]/mL — ABNORMAL HIGH (ref 0.0–6.0)

## 2022-01-19 MED ORDER — PRENATAL VITAMIN 27 MG IRON-0.8 MG TABLET
27 mg iron- 0.8 mg | ORAL_TABLET | Freq: Every day | ORAL | 0 refills | Status: AC
Start: 2022-01-19 — End: ?

## 2022-01-19 NOTE — ED Provider Notes (Signed)
ED Provider Notes by Vida Roller, PA at 01/19/22 1302                Author: Vida Roller, PA  Service: Emergency Medicine  Author Type: Physician Assistant       Filed: 01/20/22 0309  Date of Service: 01/19/22 1302  Status: Attested           Editor: Vida Roller, PA (Physician Assistant)  Cosigner: Lynnda Shields, DO at 01/23/22 1610          Attestation signed by Lynnda Shields, DO at 01/23/22 0950          I was personally available for consultation in the emergency department.  I have reviewed the chart and agree with the documentation recorded by the midlevel  provider, including the assessment, treatment plan, and disposition.      Lynnda Shields, DO                                 26 year old obese female with no significant PMHx presents complaining of anterior right knee pain, swelling, abrasion  from an injury 3 days ago. Patient reports she fell on the knee and since has been having the pain which is gradually getting worse and cannot straighten the knee fully.  Patient rates the pain 3 out of 10 and the pain increases with straightening the  knee. Pt reports she might be pregnant, LMP was in Feb 2023 and denies right knee bruise, weakness, numbness, fever, chills          No past medical history on file.        Past Surgical History:         Procedure  Laterality  Date          ?  HX CESAREAN SECTION                 No family history on file.        Social History          Socioeconomic History         ?  Marital status:  SINGLE              Spouse name:  Not on file         ?  Number of children:  Not on file     ?  Years of education:  Not on file     ?  Highest education level:  Not on file       Occupational History        ?  Not on file       Tobacco Use         ?  Smoking status:  Never     ?  Smokeless tobacco:  Not on file       Substance and Sexual Activity         ?  Alcohol use:  No     ?  Drug use:  No     ?  Sexual activity:  Yes              Partners:  Male          Birth control/protection:  None        Other Topics  Concern        ?  Not on file       Social History Narrative        ?  Not on file          Social Determinants of Health          Financial Resource Strain: Not on file     Food Insecurity: Not on file     Transportation Needs: Not on file     Physical Activity: Not on file     Stress: Not on file     Social Connections: Not on file     Intimate Partner Violence: Not At Risk        ?  Fear of Current or Ex-Partner: No     ?  Emotionally Abused: No     ?  Physically Abused: No     ?  Sexually Abused: No       Housing Stability: Not on file              ALLERGIES: Patient has no known allergies.      Review of Systems    Constitutional:  Negative for chills, fatigue and fever.    HENT:  Negative for drooling, mouth sores and sore throat.     Eyes:  Negative for discharge and redness.    Respiratory:  Negative for shortness of breath, wheezing and stridor.     Cardiovascular:  Negative for chest pain, palpitations and leg swelling.    Gastrointestinal:  Negative for diarrhea, nausea and vomiting.    Endocrine: Negative for polydipsia, polyphagia and polyuria.    Genitourinary:  Negative for dysuria, frequency and urgency.    Musculoskeletal:  Positive for arthralgias (right knee pain). Negative for gait problem  and neck stiffness.    Skin:  Negative for color change, pallor and rash.    Allergic/Immunologic: Negative for environmental allergies.    Neurological:  Negative for dizziness, weakness, light-headedness and headaches.    Psychiatric/Behavioral:  Negative for agitation. The patient is not nervous/anxious.     All other systems reviewed and are negative.        Vitals:          01/19/22 1244        BP:  118/73     Pulse:  (!) 101     Resp:  17     Temp:  98.1 F (36.7 C)     SpO2:  99%     Weight:  85.7 kg (189 lb)        Height:  5\' 3"  (1.6 m)                Physical Exam   Vitals and nursing note reviewed.    Constitutional:        General: She is not  in acute distress.      Appearance: She is well-developed. She is not diaphoretic.    HENT:       Head: Normocephalic.       Right Ear: External ear normal.       Left Ear: External ear normal.       Nose: Nose normal.    Eyes:       Conjunctiva/sclera: Conjunctivae normal.       Pupils: Pupils are equal, round, and reactive to light.     Cardiovascular:       Rate and Rhythm: Normal rate and regular rhythm.       Heart sounds: Normal heart sounds. No murmur heard.     No friction rub. No gallop.    Pulmonary:       Effort: Pulmonary effort  is normal.       Breath sounds: Normal breath sounds.     Abdominal:       General: Bowel sounds are normal. There is no distension.       Palpations: Abdomen is soft. There is no mass.       Tenderness: There is no abdominal tenderness. There is no guarding or rebound.       Hernia: No hernia is present.     Musculoskeletal:       Cervical back: Normal range of motion and neck supple.       Right knee: Swelling (Mild swelling with scab abrasion anterior right knee) and  bony tenderness (+TTP right patellar) present. No deformity, effusion, erythema, ecchymosis or lacerations. Normal range of motion.  Tenderness (+TTP anteriorl right knee. Neg knee ANT/POS draw, V/V, McMurray, Lachman.) present. No LCL laxity or MCL laxity. Normal alignment,  normal meniscus and normal patellar mobility.       Left knee: No swelling, deformity, effusion, erythema, ecchymosis, lacerations or bony tenderness. Normal range of motion. No tenderness. No LCL laxity or MCL laxity.Normal alignment, normal meniscus and normal patellar mobility.    Skin:      General: Skin is warm.       Capillary Refill: Capillary refill takes less than 2 seconds.       Coloration: Skin is not pale.       Findings: No erythema or rash.    Neurological:       Mental Status: She is alert and oriented to person, place, and time.       Sensory: No sensory deficit.       Motor: No abnormal muscle tone.       Coordination:  Coordination normal.       Deep Tendon Reflexes: Reflexes normal.           Medical Decision Making   Positive urine preg test result reviewed and discussed with pt in detail.Pt likes to know how far she is for the pregnancy so  HCG and transvaginal US ordered for further eval and results reviewed and discussed with pt in detail. Ace wrap applied to right knee. Pt declined crutches given. Tylenol for pain. Elevated and rest the affected painful/swelling area. Apply ice/heat compresses  three time a day as needed for pain. Referred to orthopedist and OB. Follow up with PCP or assign PCP. Return to Ed if symptoms worsen or develop any new symptoms          Amount and/or Complexity of Data Reviewed   Independent Historian:       Details: pt   Labs: ordered. Decision-making details documented in ED Course.      Details: labs ordered and results reviewed by me   Radiology: ordered. Decision-making details documented in ED Course.      Details: transvag US ordered and results reviewed by me      Risk   OTC drugs.           Results for orders placed or performed during the hospital encounter of 01/19/22     HCG URINE, QL         Result  Value  Ref Range            HCG urine, QL  Positive (A)  NEG         BETA HCG, QT         Result  Value  Ref Range  HCG,INTACT  19,633 (H)  0.0 - 6.0 MIU/mL        US TRANSVAGINAL      Result Date: 01/19/2022   Transvaginal pelvic sonogram clinical indication pregnancy There is a single intrauterine pregnancy in variable lie. The placenta is anterior. Estimated ultrasound age is 14 weeks 5 days. Heart rate is 160 beats a  minute. Biparietal diameter is 2.8 cm.  Head circumference is 10.4 cm. Abdominal  circumference is 8.5 cm. Femur length is 1.3 cm. Humerus length is 1.4 cm. The four-chamber heart three-vessel CORD stomach kidneys and bladder are unremarkable.       Single intrauterine pregnancy 14 weeks 5 days estimated date of delivery July 15, 2022 fetal heart rate 160  beats a minute Electronically signed by:  Lane HackerHarry Boltin MD  01/19/2022 03:45 PM EST Workstation:  [id number]              Procedures      <EMERGENCY DEPARTMENT CASE SUMMARY>      Impression/Differential Diagnosis: 26 year old obese female with no significant PMHx presents complaining of anterior right knee pain, swelling, abrasion from an injury 3 days ago.  Patient reports  she fell on the knee and since has been having the pain which is gradually getting worse and cannot straighten the knee fully.  Patient rates the pain 3 out of 10 and the pain increases with straightening the knee. Pt reports she might be pregnant, LMP  was in Feb 2023 and denies right knee bruise, weakness, numbness, fever, chills      Plan: urine preg test for further eval and if negative will obtain right knee XR      ED Course: Positive urine preg test result reviewed and discussed with pt in detail.Pt  likes to know how far she is for the pregnancy so HCG and transvaginal US ordered for further eval and results reviewed and discussed with pt in detail. Ace wrap applied to right knee. Pt declined crutches given. Tylenol for pain. Elevated and rest the  affected painful/swelling area. Apply ice/heat compresses three time a day as needed for pain. Referred to orthopedist and OB. Follow up with PCP or assign PCP. Return to Ed if symptoms worsen or develop  any new symptoms      Final Impression/Diagnosis: right knee contusion. pregnancy      Patient condition at time of disposition: Stable, d/c home         I have reviewed the following home medications:        Prior to Admission medications        Not on [address]              Charles M Mayhill[place], [place]

## 2022-01-19 NOTE — ED Notes (Signed)
Dc given pt to follow upw pmd, pt amb off unit , pt will fu w pbgyn

## 2022-01-19 NOTE — ED Notes (Signed)
Physical assessment completed. The patient's level of consciousness is alert. The patient's mood is calm. The patient's appearance shows open skin. The patient does not  indicate signs or symptoms of abuse or neglect.

## 2022-01-19 NOTE — ED Notes (Signed)
ED Notes  by Cyndi Lennert, RN at 01/19/22 1258                Author: Cyndi Lennert, RN  Service: --  Author Type: Registered Nurse       Filed: 01/19/22 1259  Date of Service: 01/19/22 1258  Status: Signed          Editor: Cyndi Lennert, RN (Registered Nurse)               Sp fall 2 days ago , was accidentally tripped by son, right knee abrasion noted and some swelling to the knee as well, area cleansed , pt denies any other injury

## 2022-01-19 NOTE — ED Notes (Signed)
ED Triage Notes by Mylo Red, RN at 01/19/22 1243                Author: Mylo Red, RN  Service: --  Author Type: Registered Nurse       Filed: 01/19/22 1243  Date of Service: 01/19/22 1243  Status: Signed          Editor: Mylo Red, RN (Registered Nurse)               Right knee pain after trip & fall 3 days ago

## 2022-01-19 NOTE — ED Notes (Signed)
Pt to us dept
